# Patient Record
Sex: Female | Born: 1980 | Race: White | Hispanic: No | Marital: Married | Smoking: Former smoker
Health system: Southern US, Community
[De-identification: ages and names within clinical notes are randomized; demographics above are authoritative.]

## PROBLEM LIST (undated history)

## (undated) ENCOUNTER — Inpatient Hospital Stay (HOSPITAL_COMMUNITY): Payer: Self-pay

## (undated) DIAGNOSIS — R000 Tachycardia, unspecified: Secondary | ICD-10-CM

## (undated) DIAGNOSIS — Z9889 Other specified postprocedural states: Secondary | ICD-10-CM

## (undated) DIAGNOSIS — Z8719 Personal history of other diseases of the digestive system: Secondary | ICD-10-CM

## (undated) DIAGNOSIS — O24419 Gestational diabetes mellitus in pregnancy, unspecified control: Secondary | ICD-10-CM

## (undated) DIAGNOSIS — A6000 Herpesviral infection of urogenital system, unspecified: Secondary | ICD-10-CM

## (undated) DIAGNOSIS — Z87442 Personal history of urinary calculi: Secondary | ICD-10-CM

## (undated) DIAGNOSIS — R51 Headache: Secondary | ICD-10-CM

## (undated) DIAGNOSIS — R519 Headache, unspecified: Secondary | ICD-10-CM

## (undated) DIAGNOSIS — N200 Calculus of kidney: Secondary | ICD-10-CM

## (undated) DIAGNOSIS — D649 Anemia, unspecified: Secondary | ICD-10-CM

## (undated) DIAGNOSIS — Z9049 Acquired absence of other specified parts of digestive tract: Secondary | ICD-10-CM

## (undated) HISTORY — DX: Calculus of kidney: N20.0

## (undated) HISTORY — PX: TUBAL LIGATION: SHX77

## (undated) HISTORY — DX: Herpesviral infection of urogenital system, unspecified: A60.00

## (undated) HISTORY — PX: EYE SURGERY: SHX253

---
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
## 2002-08-30 ENCOUNTER — Other Ambulatory Visit: Admission: RE | Admit: 2002-08-30 | Discharge: 2002-08-30 | Payer: Self-pay | Admitting: Family Medicine

## 2003-03-16 DIAGNOSIS — Z9049 Acquired absence of other specified parts of digestive tract: Secondary | ICD-10-CM

## 2003-03-16 HISTORY — DX: Acquired absence of other specified parts of digestive tract: Z90.49

## 2003-03-16 HISTORY — PX: CHOLECYSTECTOMY: SHX55

## 2007-03-16 DIAGNOSIS — Z9889 Other specified postprocedural states: Secondary | ICD-10-CM

## 2007-03-16 HISTORY — PX: BREAST REDUCTION SURGERY: SHX8

## 2007-03-16 HISTORY — DX: Other specified postprocedural states: Z98.890

## 2008-04-27 ENCOUNTER — Emergency Department (HOSPITAL_COMMUNITY): Admission: EM | Admit: 2008-04-27 | Discharge: 2008-04-27 | Payer: Self-pay | Admitting: Emergency Medicine

## 2008-06-10 ENCOUNTER — Emergency Department (HOSPITAL_COMMUNITY): Admission: EM | Admit: 2008-06-10 | Discharge: 2008-06-10 | Payer: Self-pay | Admitting: Emergency Medicine

## 2008-06-14 ENCOUNTER — Encounter (INDEPENDENT_AMBULATORY_CARE_PROVIDER_SITE_OTHER): Payer: Self-pay | Admitting: *Deleted

## 2008-06-14 ENCOUNTER — Inpatient Hospital Stay (HOSPITAL_COMMUNITY): Admission: EM | Admit: 2008-06-14 | Discharge: 2008-06-19 | Payer: Self-pay | Admitting: Emergency Medicine

## 2008-07-03 ENCOUNTER — Encounter (INDEPENDENT_AMBULATORY_CARE_PROVIDER_SITE_OTHER): Payer: Self-pay | Admitting: *Deleted

## 2008-07-03 ENCOUNTER — Encounter: Admission: RE | Admit: 2008-07-03 | Discharge: 2008-07-03 | Payer: Self-pay | Admitting: Internal Medicine

## 2008-07-23 ENCOUNTER — Encounter: Admission: RE | Admit: 2008-07-23 | Discharge: 2008-08-21 | Payer: Self-pay | Admitting: Neurology

## 2008-08-27 ENCOUNTER — Emergency Department (HOSPITAL_COMMUNITY): Admission: EM | Admit: 2008-08-27 | Discharge: 2008-08-27 | Payer: Self-pay | Admitting: Emergency Medicine

## 2008-10-15 ENCOUNTER — Ambulatory Visit: Payer: Self-pay | Admitting: Internal Medicine

## 2008-10-15 DIAGNOSIS — R143 Flatulence: Secondary | ICD-10-CM

## 2008-10-15 DIAGNOSIS — R142 Eructation: Secondary | ICD-10-CM

## 2008-10-15 DIAGNOSIS — R141 Gas pain: Secondary | ICD-10-CM

## 2008-10-15 LAB — CONVERTED CEMR LAB
BUN: 10 mg/dL (ref 6–23)
Basophils Relative: 0.8 % (ref 0.0–3.0)
CO2: 27 meq/L (ref 19–32)
Calcium: 9.5 mg/dL (ref 8.4–10.5)
Chloride: 108 meq/L (ref 96–112)
Creatinine, Ser: 0.7 mg/dL (ref 0.4–1.2)
GFR calc non Af Amer: 107.14 mL/min (ref >60)
Glucose, Bld: 103 mg/dL — ABNORMAL HIGH (ref 70–99)
HCT: 42.4 % (ref 36.0–46.0)
Hemoglobin: 14.3 g/dL (ref 12.0–15.0)
Lymphocytes Relative: 27.8 % (ref 12.0–46.0)
Lymphs Abs: 1.3 10*3/uL (ref 0.7–4.0)
MCHC: 33.6 g/dL (ref 30.0–36.0)
Monocytes Relative: 6.9 % (ref 3.0–12.0)
Neutro Abs: 3.1 10*3/uL (ref 1.4–7.7)
RBC: 4.77 M/uL (ref 3.87–5.11)
TSH: 0.82 microintl units/mL (ref 0.35–5.50)
Total Bilirubin: 0.8 mg/dL (ref 0.3–1.2)

## 2008-10-16 ENCOUNTER — Encounter: Payer: Self-pay | Admitting: Internal Medicine

## 2008-10-16 ENCOUNTER — Ambulatory Visit: Payer: Self-pay | Admitting: Internal Medicine

## 2010-01-09 ENCOUNTER — Telehealth (INDEPENDENT_AMBULATORY_CARE_PROVIDER_SITE_OTHER): Payer: Self-pay | Admitting: *Deleted

## 2010-04-14 NOTE — Progress Notes (Signed)
  Phone Note Other Incoming   Request: Send information Summary of Call: Received a completed [place] medical records release. Patient is requesting record copies from 03/15/2008 to 03/14/2009. Request forwarded to [employer].

## 2010-06-24 LAB — COMPREHENSIVE METABOLIC PANEL
Albumin: 3.2 g/dL — ABNORMAL LOW (ref 3.5–5.2)
BUN: 5 mg/dL — ABNORMAL LOW (ref 6–23)
Calcium: 8.4 mg/dL (ref 8.4–10.5)
Chloride: 114 mEq/L — ABNORMAL HIGH (ref 96–112)
Creatinine, Ser: 0.57 mg/dL (ref 0.4–1.2)
GFR calc non Af Amer: >60 mL/min (ref >60)
Total Bilirubin: 0.5 mg/dL (ref 0.3–1.2)

## 2010-06-24 LAB — BASIC METABOLIC PANEL
BUN: 4 mg/dL — ABNORMAL LOW (ref 6–23)
BUN: 7 mg/dL (ref 6–23)
CO2: 21 mEq/L (ref 19–32)
Chloride: 111 mEq/L (ref 96–112)
Chloride: 113 mEq/L — ABNORMAL HIGH (ref 96–112)
Creatinine, Ser: 0.63 mg/dL (ref 0.4–1.2)
GFR calc Af Amer: >60 mL/min (ref >60)
GFR calc non Af Amer: >60 mL/min (ref >60)
Glucose, Bld: 113 mg/dL — ABNORMAL HIGH (ref 70–99)
Potassium: 3.3 mEq/L — ABNORMAL LOW (ref 3.5–5.1)
Potassium: 3.9 mEq/L (ref 3.5–5.1)
Sodium: 136 mEq/L (ref 135–145)

## 2010-06-24 LAB — URINALYSIS, ROUTINE W REFLEX MICROSCOPIC
Hgb urine dipstick: NEGATIVE
Nitrite: NEGATIVE
Protein, ur: NEGATIVE mg/dL
Specific Gravity, Urine: 1.017 (ref 1.005–1.030)
Urobilinogen, UA: 1 mg/dL (ref 0.0–1.0)

## 2010-06-24 LAB — CBC
HCT: 36.9 % (ref 36.0–46.0)
HCT: 39.2 % (ref 36.0–46.0)
Hemoglobin: 12.5 g/dL (ref 12.0–15.0)
MCHC: 34.1 g/dL (ref 30.0–36.0)
MCV: 87.9 fL (ref 78.0–100.0)
Platelets: 332 10*3/uL (ref 150–400)
RBC: 4.15 MIL/uL (ref 3.87–5.11)
WBC: 10.6 10*3/uL — ABNORMAL HIGH (ref 4.0–10.5)
WBC: 5.9 10*3/uL (ref 4.0–10.5)

## 2010-06-24 LAB — DIFFERENTIAL
Basophils Absolute: 0 10*3/uL (ref 0.0–0.1)
Lymphocytes Relative: 15 % (ref 12–46)
Monocytes Absolute: 0.5 10*3/uL (ref 0.1–1.0)
Neutro Abs: 8.4 10*3/uL — ABNORMAL HIGH (ref 1.7–7.7)

## 2010-06-24 LAB — URINE MICROSCOPIC-ADD ON

## 2010-06-24 LAB — LIPASE, BLOOD: Lipase: 19 U/L (ref 11–59)

## 2010-06-25 LAB — COMPREHENSIVE METABOLIC PANEL
AST: 16 U/L (ref 0–37)
BUN: 5 mg/dL — ABNORMAL LOW (ref 6–23)
CO2: 21 mEq/L (ref 19–32)
Calcium: 8.7 mg/dL (ref 8.4–10.5)
Creatinine, Ser: 0.65 mg/dL (ref 0.4–1.2)
GFR calc Af Amer: >60 mL/min (ref >60)
GFR calc non Af Amer: >60 mL/min (ref >60)

## 2010-06-25 LAB — CBC
MCHC: 34 g/dL (ref 30.0–36.0)
MCV: 87.9 fL (ref 78.0–100.0)
RBC: 4.56 MIL/uL (ref 3.87–5.11)

## 2010-06-25 LAB — URINALYSIS, ROUTINE W REFLEX MICROSCOPIC
Nitrite: NEGATIVE
Protein, ur: NEGATIVE mg/dL
Specific Gravity, Urine: 1.012 (ref 1.005–1.030)
Urobilinogen, UA: 1 mg/dL (ref 0.0–1.0)

## 2010-06-25 LAB — DIFFERENTIAL
Eosinophils Relative: 1 % (ref 0–5)
Lymphocytes Relative: 29 % (ref 12–46)
Lymphs Abs: 2.2 10*3/uL (ref 0.7–4.0)
Neutro Abs: 4.6 10*3/uL (ref 1.7–7.7)
Neutrophils Relative %: 63 % (ref 43–77)

## 2010-06-25 LAB — LIPASE, BLOOD: Lipase: 23 U/L (ref 11–59)

## 2010-06-25 LAB — POCT PREGNANCY, URINE: Preg Test, Ur: NEGATIVE

## 2010-06-30 LAB — POCT PREGNANCY, URINE: Preg Test, Ur: NEGATIVE

## 2010-07-28 NOTE — Discharge Summary (Signed)
NAME:  Teresa Massey, Teresa Massey NO.:  [id number]   MEDICAL RECORD NO.:  [id number]          PATIENT TYPE:  INP   LOCATION:  1408                         FACILITY:  [facility]   PHYSICIAN:  Fleet Contras, M.D.    DATE OF BIRTH:  Jan 08, 1981   DATE OF ADMISSION:  06/14/2008  DATE OF DISCHARGE:  06/19/2008                               DISCHARGE SUMMARY   HISTORY OF PRESENTING ILLNESS:  Please see the dictated H and P for full  details of presentation.  In summary, Ms. Teresa Massey was admitted via the  emergency room at [facility] with complaints of headaches,  upper abdominal pain associated with nausea, vomiting, inability to keep  food and drinks down, and failure of outpatient treatment of her  symptoms.  On admission she was not in acute respiratory or painful  distress and vital signs were stable.  Exam essentially was negative  except for some minimal epigastric tenderness.  CT scan of the abdomen  showed mild ileocolic mesenteric lymph nodes, possibly duodenitis.  X-  ray of the chest showed no acute cardiopulmonary disease.  She had a  slight elevated white count of 10.6.  Urinalysis was negative for  urinary tract infection.  She was admitted to the hospital for pain  management, nausea medications, IV fluids, and close monitoring.   HOSPITAL COURSE:  During the course of admission, her condition was slow  to improve but she was eventually able to tolerate oral intake and she  had some muscle spasms in her neck region and she was given some muscle  relaxants which helped with the symptoms.  She was able to be tapered  off of intravenous analgesic therapy and then planned for discharge  home.   DISCHARGE DIAGNOSES:  1. Migraine headaches.  2. Neck pain.  3. Dyspepsia.   CONDITION ON DISCHARGE:  Stable.   DISPOSITION:  Was for home.   FOLLOWUP:  Was to be with me in the office in about 2 weeks.   DISCHARGE MEDICATIONS:  1. Topamax 200 mg at bedtime.  2.  Relpax 40 mg as needed for headaches.  3. Norco 5/325 one p.o. q.6 p.r.n. for pain.  4. Zofran 4 mg every 6 hours p.r.n. for nausea and vomiting.  5. Adderall XR 5 mg daily.  6. Robaxin 750 mg one p.o. q.i.d. p.r.n.   This discharge plan was discussed with her and her questions were  answered.      Fleet Contras, M.D.  Electronically Signed     EA/MEDQ  D:  08/08/2008  T:  08/08/2008  Job:  [id number]

## 2010-07-28 NOTE — H&P (Signed)
NAME:  Teresa Massey, Teresa Massey NO.:  [id number]   MEDICAL RECORD NO.:  [id number]          PATIENT TYPE:  INP   LOCATION:  1408                         [facility]ACILITY:  [facility]   PHYSICIAN:  Jackie Plum, M.D.DATE OF BIRTH:  [birth year]   DATE OF ADMISSION:  06/14/2008  DATE OF DISCHARGE:  06/19/2008                              HISTORY & PHYSICAL   ADMISSION DIAGNOSES:  1. Nausea and vomiting due to a combination of      gastroparesis/gastritis.  2. Dehydration due to nausea and vomiting.  3. Mild leukocytosis, likely stress induced.  4. Hypokalemia, improved from 3.4 on 06/10/2008 to 3.5 today.  5. Mild metabolic acidosis likely due to gastrointestinal loss of      bicarbonate.  6. Hypoalbuminemia.  The patient's albumin had dropped from 3.4 to 3.2      over the last 4 days with dehydration.  Therefore, true value may      be lower than 3.2, likely due to protein calorie malnutrition      related to her illness.   CHIEF COMPLAINT:  Nausea and vomiting.   HISTORY OF PRESENT ILLNESS:  Teresa Massey was admitted to [facility] today due to persistent nausea and vomiting and failure of  outpatient treatment.  She had been seen in the ED on 06/10/2008 for the  same and was discharged home with supportive care.  She had had upper  abdominal pain which was sharp associated with nausea and vomiting.  The  pain was said to be mild to moderate in intensity.  No radiation.  She  had had no fever or chills.  She denies any dysuria or frequency of  micturition.  In the ED, the patient was treated with analgesics but  continued to have problems with upper GI symptoms and was admitted for  supportive care.   PAST MEDICAL HISTORY:  The patient denies any history of diabetes  mellitus.  Positive for a history of migraine headaches and renal  stones.  Past medical history is otherwise unremarkable.   PAST SURGICAL HISTORY:  Status post cholecystectomy.   MEDICATIONS:  1.  Depo-Provera.  2. Topamax 200 mg nightly.  3. Relpax 40 mg p.r.n.  4. Norco 5/325 mg p.r.n.  5. Zofran 4 mg p.r.n.  6. Adderall 20 mg daily.   ALLERGIES:  The patient is allergic to MORPHINE AND EITHER REGLAN OR  COMPAZINE.  The patient could not remember which one, but she says she  is allergic to one of these 2 medications.   SOCIAL HISTORY:  The patient does not smoke cigarettes or drink alcohol.  She denies any illicit drug use.  She lives with her parents and is a  Consulting civil engineer at [employer]  She has not had any recent travel.   REVIEW OF SYSTEMS:  A 10-point review of systems was unremarkable except  as noted above.   PHYSICAL EXAMINATION:  VITAL SIGNS:  BP 121/83, pulse 105, respirations  21, temperature 97.8 degrees Fahrenheit, O2 saturation 98% on room air.  GENERAL:  The patient was not in acute cardiopulmonary distress.  She  was resting in  bed.  HEENT:  Normocephalic and atraumatic.  Pupils are equal, round and  reactive to light.  Extraocular movements are intact.  Oropharynx dry.  Conjunctival pallor absent.  NECK:  No JVD.  LUNGS:  Clear to auscultation.  CARDIAC:  Regular rate and rhythm.  No murmurs, gallops or rubs.  ABDOMEN:  Soft.  Minimal epigastric tenderness without rebound  tenderness.  EXTREMITIES:  No clubbing, cyanosis or edema.  CNS:  Alert and oriented x3.  No acute focal deficit.   LABORATORIES:  The patient had a CT of the abdomen and pelvis which  showed mild ileocolonic mesenteric lymph nodes possibly due to adenitis.  Rare lung base granuloma and nonobstructive renal calculus on the right.  No evidence of appendicitis or acute pelvic process noted.  X-ray of her  chest done on her previous ED visit on 06/10/2008 revealed no active  disease.  The patient also had an upper GI study in October 2000 which  showed a small hiatal hernia.  The patient's labs today were reviewed  and noted by me to have a white count of 10.6, platelet count of 332,  hemoglobin  13.4, hematocrit 39.2.  Potassium 3.4 on 06/10/2008 and 3.5  today.  Chloride 104, sodium 139, CO2 18, glucose 95, BUN 5, creatinine  0.57, albumin 3.2.  Otherwise, LFTs within normal limits.  Amylase and  lipase noted to be within normal limits.  UA negative for any evidence  of UTI.   PLAN:  The patient was admitted for pain control, anti-nausea medication  and antiemetics, supportive care and IV fluids.  She will be on clear  liquids for now.  We will advance her diet as tolerated.  We will follow  her electrolytes and renal function.  Further recommendations will be  made as her database expands.      Jackie Plum, M.D.  Electronically Signed     GO/MEDQ  D:  06/14/2008  T:  06/14/2008  Job:  [id number]

## 2010-09-07 IMAGING — CR DG ABDOMEN 1V
1 series · 1 of 1 positions shown · non-contrast
Comparison: CT 06/14/2008

CLINICAL DATA: Nausea, vomiting.

ABDOMEN - 1 VIEW

[t abdomen supine]
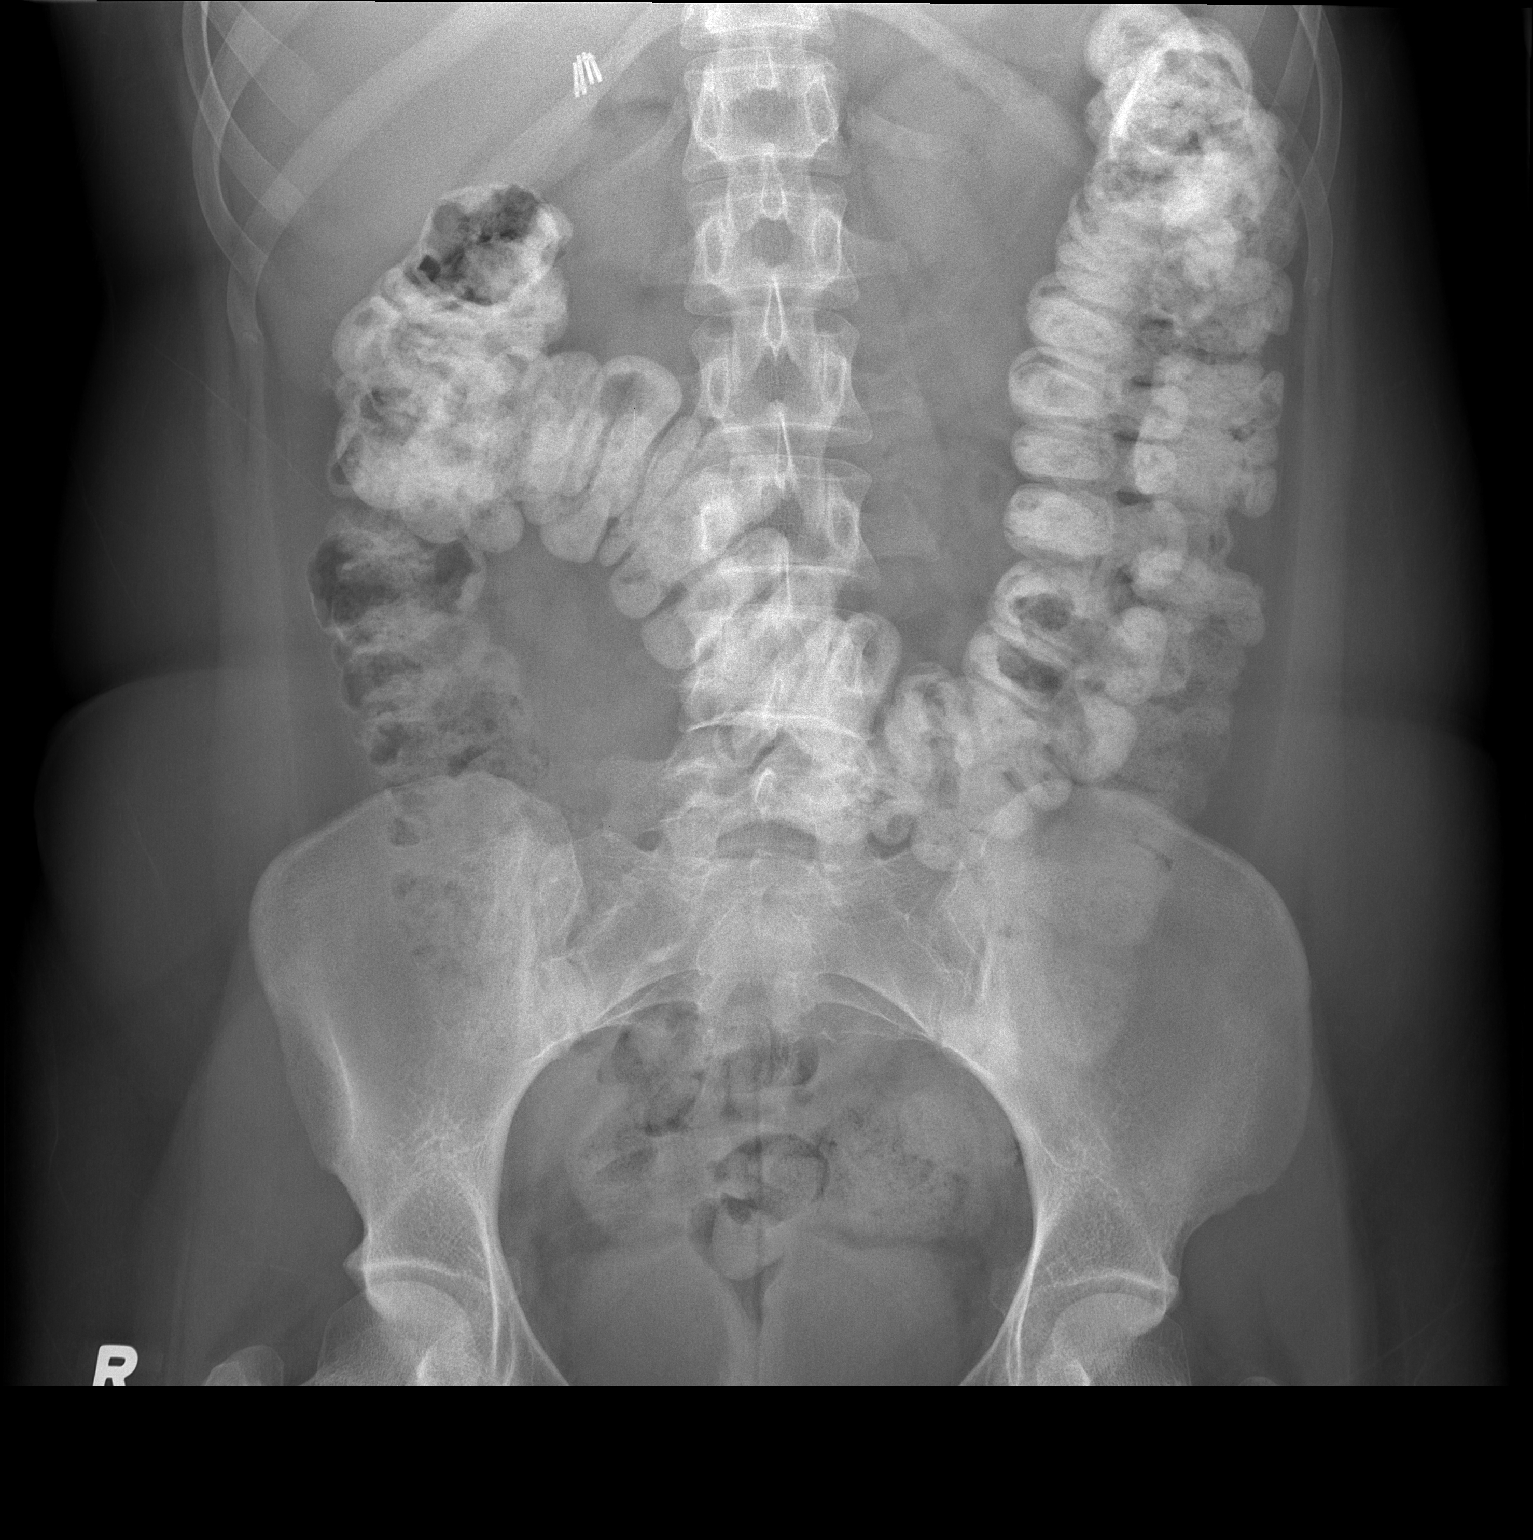

[1 of 1 positions shown; findings below may reference images not displayed]

FINDINGS: Oral contrast material noted throughout the colon which
is decompressed.  No small bowel dilatation.  The patient is status
post cholecystectomy.  No organomegaly or suspicious calcification.
No acute bony abnormality.
IMPRESSION: Oral contrast material throughout the colon.

No acute findings.

Prior cholecystectomy.

## 2013-11-13 ENCOUNTER — Encounter: Payer: Self-pay | Admitting: Internal Medicine

## 2014-02-01 LAB — US OB COMP + 14 WK

## 2014-03-04 ENCOUNTER — Encounter: Payer: Self-pay | Admitting: *Deleted

## 2014-03-04 ENCOUNTER — Encounter: Payer: Self-pay | Admitting: Family Medicine

## 2014-03-04 ENCOUNTER — Ambulatory Visit (INDEPENDENT_AMBULATORY_CARE_PROVIDER_SITE_OTHER): Payer: Self-pay | Admitting: Family Medicine

## 2014-03-04 VITALS — BP 121/69 | HR 81 | Temp 98.8°F

## 2014-03-04 DIAGNOSIS — O4413 Placenta previa with hemorrhage, third trimester: Secondary | ICD-10-CM

## 2014-03-04 DIAGNOSIS — Z3493 Encounter for supervision of normal pregnancy, unspecified, third trimester: Secondary | ICD-10-CM

## 2014-03-04 LAB — POCT URINALYSIS DIP (DEVICE)
Bilirubin Urine: NEGATIVE
GLUCOSE, UA: 100 mg/dL — AB
HGB URINE DIPSTICK: NEGATIVE
Ketones, ur: NEGATIVE mg/dL
NITRITE: NEGATIVE
PH: 6 (ref 5.0–8.0)
Protein, ur: NEGATIVE mg/dL
Urobilinogen, UA: 0.2 mg/dL (ref 0.0–1.0)

## 2014-03-04 NOTE — Patient Instructions (Signed)
Placenta Previa   Placenta previa is a condition in pregnant women where the placenta implants in the lower part of the uterus. The placenta either partially or completely covers the opening to the cervix. This is a problem because the baby must pass through the cervix during delivery. There are three types of placenta previa. They include:   1. Marginal placenta previa. The placenta is near the cervix, but does not cover the opening.  2. Partial placenta previa. The placenta covers part of the cervical opening.  3. Complete placenta previa. The placenta covers the entire cervical opening.    Depending on the type of placenta previa, there is a chance the placenta may move into a normal position and no longer cover the cervix as the pregnancy progresses. It is important to keep all prenatal visits with your caregiver.   RISK FACTORS  You may be more likely to develop placenta previa if you:   · Are carrying more than one baby (multiples).    · Have an abnormally shaped uterus.    · Have scars on the lining of the uterus.    · Had previous surgeries involving the uterus, such as a cesarean delivery.    · Have delivered a baby previously.    · Have a history of placenta previa.    · Have smoked or used cocaine during pregnancy.    · Are age 35 or older during pregnancy.    SYMPTOMS  The main symptom of placenta previa is sudden, painless vaginal bleeding during the second half of pregnancy. The amount of bleeding can be light to very heavy. The bleeding may stop on its own, but almost always returns. Cramping, regular contractions, abdominal pain, and lower back pain can also occur with placenta previa.   DIAGNOSIS  Placenta previa can be diagnosed through an ultrasound by finding where the placenta is located. The ultrasound may find placenta previa either during a routine prenatal visit or after vaginal bleeding is noticed. If you are diagnosed with placenta previa, your caregiver may avoid vaginal exams to reduce  the risk of heavy bleeding. There is a chance that placenta previa may not be diagnosed until bleeding occurs during labor.   TREATMENT  Specific treatment depends on:   · How much you are bleeding or if the bleeding has stopped.  · How far along you are in your pregnancy.    · The condition of the baby.    · The location of the baby and placenta.    · The type of placenta previa.    Depending on the factors above, your caregiver may recommend:   · Decreased activity.    · Bed rest at home or in the hospital.  · Pelvic rest. This means no sex, using tampons, douching, pelvic exams, or placing anything into the vagina.  · A blood transfusion to replace maternal blood loss.  · A cesarean delivery if the bleeding is heavy and cannot be controlled or the placenta completely covers the cervix.  · Medication to stop premature labor or mature the fetal lungs if delivery is needed before the pregnancy is full term.    WHEN SHOULD YOU SEEK IMMEDIATE MEDICAL CARE IF YOU ARE SENT HOME WITH PLACENTA PREVIA?  Seek immediate medical care if you show any symptoms of placenta previa. You will need to go to the hospital to get checked immediately. Again, those symptoms are:  · Sudden, painless vaginal bleeding, even a small amount.  · Cramping or regular contractions.  · Lower back or abdominal pain.  Document Released: 03/01/2005   Document Revised: 11/01/2012 Document Reviewed: 06/02/2012  [employer]® Patient Information ©2015 [employer]. This information is not intended to replace advice given to you by your health care provider. Make sure you discuss any questions you have with your health care provider.

## 2014-03-04 NOTE — Progress Notes (Signed)
   Subjective:    Teresa Massey is a [id number] 1920w3d being seen today for her first obstetrical visit, previously received care in [place].  Her obstetrical history is significant for UTI, 2 kidney stones.  current pregnancy has had spotting and pelvic pressure since October; last episode of spotting was Tuesday last week.  Spotting has been bright red, not related to intercourse now usually occur when pt is very active.. Patient does intend to breast feed. Pregnancy history fully reviewed.  Patient reports backache, bleeding, heartburn, no leaking and occasional contractions.  Filed Vitals:   03/04/14 1455  BP: 121/69  Pulse: 81  Temp: 98.8 F (37.1 C)    HISTORY: OB History  Gravida Para Term Preterm AB SAB TAB Ectopic Multiple Living  3 1 1  1  1   1     # Outcome Date GA Lbr Len/2nd Weight Sex Delivery Anes PTL Lv  3 Current           2 Term 11/05/06 4140w0d  6 lb 7 oz (2.92 kg) M  EPI N Y  1 TAB 2003             Past Medical History  Diagnosis Date  . Kidney stones 2008, 2011   Past Surgical History  Procedure Laterality Date  . Breast reduction surgery  2009  . Cholecystectomy  2005   History reviewed. No pertinent family history.   Exam    Uterus:      Skin: normal coloration and turgor, no rashes    Neurologic: oriented, normal   Extremities: normal strength, tone, and muscle mass   HEENT PERRLA   Mouth/Teeth mucous membranes moist, pharynx normal without lesions   Neck supple   Cardiovascular: Regular rate   Respiratory:  ear and throat exam is normal, neck free of mass or lymphadenopathy, normal effort   Abdomen: Soft, nontender, gravid          Assessment:    Pregnancy: [id number] Patient Active Problem List   Diagnosis Date Noted  . ABDOMINAL BLOATING 10/15/2008  . EPIGASTRIC PAIN 10/15/2008        Plan:     Initial labs drawn. Prenatal vitamins. Problem list reviewed and updated. Genetic Screening discussed, too late and unsure if she  had done at [place]  Ultrasound discussed; fetal survey: results from OSH requested.  Follow up in 4 weeks. 50% of 20 min visit spent on counseling and coordination of care.  Placenta previa, vaginal bleeding precautions discussed F/u ultrasound ordered to eval for previa given low lying placenta on previous ultrasound although I do not currently  Have records available to me.  Brigid Vandekamp ROCIO 03/04/2014

## 2014-03-04 NOTE — Progress Notes (Signed)
Pain/pelvic- lower abd, pelvis, vaginal  Pt reports a low lying placenta previa from provider in [place].  The provider recommended follow up US.  New ob/ 28 week packets given  Tdap vaccine consented Declined flu vaccine

## 2014-03-05 DIAGNOSIS — Z3493 Encounter for supervision of normal pregnancy, unspecified, third trimester: Secondary | ICD-10-CM | POA: Insufficient documentation

## 2014-03-05 DIAGNOSIS — O4440 Low lying placenta NOS or without hemorrhage, unspecified trimester: Secondary | ICD-10-CM | POA: Insufficient documentation

## 2014-03-05 LAB — GLUCOSE TOLERANCE, 1 HOUR (50G) W/O FASTING: Glucose, 1 Hour GTT: 146 mg/dL — ABNORMAL HIGH (ref 70–140)

## 2014-03-05 LAB — CBC
HCT: 33.7 % — ABNORMAL LOW (ref 36.0–46.0)
Hemoglobin: 11.5 g/dL — ABNORMAL LOW (ref 12.0–15.0)
MCH: 31.3 pg (ref 26.0–34.0)
MCHC: 34.1 g/dL (ref 30.0–36.0)
MCV: 91.6 fL (ref 78.0–100.0)
MPV: 10 fL (ref 9.4–12.4)
Platelets: 258 10*3/uL (ref 150–400)
RBC: 3.68 MIL/uL — ABNORMAL LOW (ref 3.87–5.11)
RDW: 13.8 % (ref 11.5–15.5)
WBC: 10.8 10*3/uL — ABNORMAL HIGH (ref 4.0–10.5)

## 2014-03-05 LAB — RPR

## 2014-03-05 LAB — HIV ANTIBODY (ROUTINE TESTING W REFLEX): HIV: NONREACTIVE

## 2014-03-06 ENCOUNTER — Other Ambulatory Visit: Payer: Self-pay | Admitting: Family Medicine

## 2014-03-06 ENCOUNTER — Ambulatory Visit (HOSPITAL_COMMUNITY)
Admission: RE | Admit: 2014-03-06 | Discharge: 2014-03-06 | Disposition: A | Discharge status: Home / Self Care | Payer: Medicaid Other | Source: Ambulatory Visit | Attending: Family Medicine | Admitting: Family Medicine

## 2014-03-06 DIAGNOSIS — Z3493 Encounter for supervision of normal pregnancy, unspecified, third trimester: Secondary | ICD-10-CM

## 2014-03-06 DIAGNOSIS — Z09 Encounter for follow-up examination after completed treatment for conditions other than malignant neoplasm: Secondary | ICD-10-CM

## 2014-03-06 DIAGNOSIS — O4413 Placenta previa with hemorrhage, third trimester: Secondary | ICD-10-CM

## 2014-03-07 DIAGNOSIS — O365930 Maternal care for other known or suspected poor fetal growth, third trimester, not applicable or unspecified: Secondary | ICD-10-CM | POA: Insufficient documentation

## 2014-03-07 DIAGNOSIS — Z3689 Encounter for other specified antenatal screening: Secondary | ICD-10-CM | POA: Insufficient documentation

## 2014-03-07 DIAGNOSIS — Z3A30 30 weeks gestation of pregnancy: Secondary | ICD-10-CM | POA: Insufficient documentation

## 2014-03-09 ENCOUNTER — Encounter (HOSPITAL_COMMUNITY): Payer: Self-pay

## 2014-03-09 ENCOUNTER — Inpatient Hospital Stay (HOSPITAL_COMMUNITY)
Admission: AD | Admit: 2014-03-09 | Discharge: 2014-03-09 | Disposition: A | Discharge status: Home / Self Care | Payer: Medicaid Other | Source: Ambulatory Visit | Attending: Obstetrics & Gynecology | Admitting: Obstetrics & Gynecology

## 2014-03-09 DIAGNOSIS — O4703 False labor before 37 completed weeks of gestation, third trimester: Secondary | ICD-10-CM | POA: Insufficient documentation

## 2014-03-09 DIAGNOSIS — Z3A31 31 weeks gestation of pregnancy: Secondary | ICD-10-CM | POA: Diagnosis not present

## 2014-03-09 DIAGNOSIS — M2550 Pain in unspecified joint: Secondary | ICD-10-CM | POA: Insufficient documentation

## 2014-03-09 DIAGNOSIS — Z87442 Personal history of urinary calculi: Secondary | ICD-10-CM | POA: Diagnosis not present

## 2014-03-09 DIAGNOSIS — R109 Unspecified abdominal pain: Secondary | ICD-10-CM | POA: Diagnosis present

## 2014-03-09 DIAGNOSIS — O4693 Antepartum hemorrhage, unspecified, third trimester: Secondary | ICD-10-CM

## 2014-03-09 DIAGNOSIS — M25551 Pain in right hip: Secondary | ICD-10-CM

## 2014-03-09 LAB — URINALYSIS, ROUTINE W REFLEX MICROSCOPIC
Bilirubin Urine: NEGATIVE
Glucose, UA: NEGATIVE mg/dL
Hgb urine dipstick: NEGATIVE
Ketones, ur: 15 mg/dL — AB
NITRITE: NEGATIVE
PH: 6 (ref 5.0–8.0)
PROTEIN: NEGATIVE mg/dL
Specific Gravity, Urine: >1.03 — ABNORMAL HIGH (ref 1.005–1.030)
Urobilinogen, UA: 1 mg/dL (ref 0.0–1.0)

## 2014-03-09 LAB — URINE MICROSCOPIC-ADD ON

## 2014-03-09 NOTE — Discharge Instructions (Signed)
Third Trimester of Pregnancy °The third trimester is from week 29 through week 42, months 7 through 9. The third trimester is a time when the fetus is growing rapidly. At the end of the ninth month, the fetus is about 20 inches in length and weighs 6-10 pounds.  °BODY CHANGES °Your body goes through many changes during pregnancy. The changes vary from woman to woman.  °· Your weight will continue to increase. You can expect to gain 25-35 pounds (11-16 kg) by the end of the pregnancy. °· You may begin to get stretch marks on your hips, abdomen, and breasts. °· You may urinate more often because the fetus is moving lower into your pelvis and pressing on your bladder. °· You may develop or continue to have heartburn as a result of your pregnancy. °· You may develop constipation because certain hormones are causing the muscles that push waste through your intestines to slow down. °· You may develop hemorrhoids or swollen, bulging veins (varicose veins). °· You may have pelvic pain because of the weight gain and pregnancy hormones relaxing your joints between the bones in your pelvis. Backaches may result from overexertion of the muscles supporting your posture. °· You may have changes in your hair. These can include thickening of your hair, rapid growth, and changes in texture. Some women also have hair loss during or after pregnancy, or hair that feels dry or thin. Your hair will most likely return to normal after your baby is born. °· Your breasts will continue to grow and be tender. A yellow discharge may leak from your breasts called colostrum. °· Your belly button may stick out. °· You may feel short of breath because of your expanding uterus. °· You may notice the fetus "dropping," or moving lower in your abdomen. °· You may have a bloody mucus discharge. This usually occurs a few days to a week before labor begins. °· Your cervix becomes thin and soft (effaced) near your due date. °WHAT TO EXPECT AT YOUR PRENATAL  EXAMS  °You will have prenatal exams every 2 weeks until week 36. Then, you will have weekly prenatal exams. During a routine prenatal visit: °· You will be weighed to make sure you and the fetus are growing normally. °· Your blood pressure is taken. °· Your abdomen will be measured to track your baby's growth. °· The fetal heartbeat will be listened to. °· Any test results from the previous visit will be discussed. °· You may have a cervical check near your due date to see if you have effaced. °At around 36 weeks, your caregiver will check your cervix. At the same time, your caregiver will also perform a test on the secretions of the vaginal tissue. This test is to determine if a type of bacteria, Group B streptococcus, is present. Your caregiver will explain this further. °Your caregiver may ask you: °· What your birth plan is. °· How you are feeling. °· If you are feeling the baby move. °· If you have had any abnormal symptoms, such as leaking fluid, bleeding, severe headaches, or abdominal cramping. °· If you have any questions. °Other tests or screenings that may be performed during your third trimester include: °· Blood tests that check for low iron levels (anemia). °· Fetal testing to check the health, activity level, and growth of the fetus. Testing is done if you have certain medical conditions or if there are problems during the pregnancy. °FALSE LABOR °You may feel small, irregular contractions that   eventually go away. These are called Braxton Hicks contractions, or false labor. Contractions may last for hours, days, or even weeks before true labor sets in. If contractions come at regular intervals, intensify, or become painful, it is best to be seen by your caregiver.  °SIGNS OF LABOR  °· Menstrual-like cramps. °· Contractions that are 5 minutes apart or less. °· Contractions that start on the top of the uterus and spread down to the lower abdomen and back. °· A sense of increased pelvic pressure or back  pain. °· A watery or bloody mucus discharge that comes from the vagina. °If you have any of these signs before the 37th week of pregnancy, call your caregiver right away. You need to go to the hospital to get checked immediately. °HOME CARE INSTRUCTIONS  °· Avoid all smoking, herbs, alcohol, and unprescribed drugs. These chemicals affect the formation and growth of the baby. °· Follow your caregiver's instructions regarding medicine use. There are medicines that are either safe or unsafe to take during pregnancy. °· Exercise only as directed by your caregiver. Experiencing uterine cramps is a good sign to stop exercising. °· Continue to eat regular, healthy meals. °· Wear a good support bra for breast tenderness. °· Do not use hot tubs, steam rooms, or saunas. °· Wear your seat belt at all times when driving. °· Avoid raw meat, uncooked cheese, cat litter boxes, and soil used by cats. These carry germs that can cause birth defects in the baby. °· Take your prenatal vitamins. °· Try taking a stool softener (if your caregiver approves) if you develop constipation. Eat more high-fiber foods, such as fresh vegetables or fruit and whole grains. Drink plenty of fluids to keep your urine clear or pale yellow. °· Take warm sitz baths to soothe any pain or discomfort caused by hemorrhoids. Use hemorrhoid cream if your caregiver approves. °· If you develop varicose veins, wear support hose. Elevate your feet for 15 minutes, 3-4 times a day. Limit salt in your diet. °· Avoid heavy lifting, wear low heal shoes, and practice good posture. °· Rest a lot with your legs elevated if you have leg cramps or low back pain. °· Visit your dentist if you have not gone during your pregnancy. Use a soft toothbrush to brush your teeth and be gentle when you floss. °· A sexual relationship may be continued unless your caregiver directs you otherwise. °· Do not travel far distances unless it is absolutely necessary and only with the approval  of your caregiver. °· Take prenatal classes to understand, practice, and ask questions about the labor and delivery. °· Make a trial run to the hospital. °· Pack your hospital bag. °· Prepare the baby's nursery. °· Continue to go to all your prenatal visits as directed by your caregiver. °SEEK MEDICAL CARE IF: °· You are unsure if you are in labor or if your water has broken. °· You have dizziness. °· You have mild pelvic cramps, pelvic pressure, or nagging pain in your abdominal area. °· You have persistent nausea, vomiting, or diarrhea. °· You have a bad smelling vaginal discharge. °· You have pain with urination. °SEEK IMMEDIATE MEDICAL CARE IF:  °· You have a fever. °· You are leaking fluid from your vagina. °· You have spotting or bleeding from your vagina. °· You have severe abdominal cramping or pain. °· You have rapid weight loss or gain. °· You have shortness of breath with chest pain. °· You notice sudden or extreme swelling   of your face, hands, ankles, feet, or legs. °· You have not felt your baby move in over an hour. °· You have severe headaches that do not go away with medicine. °· You have vision changes. °Document Released: 02/23/2001 Document Revised: 03/06/2013 Document Reviewed: 05/02/2012 °[employer]® Patient Information ©2015 [employer]. This information is not intended to replace advice given to you by your health care provider. Make sure you discuss any questions you have with your health care provider. °Fetal Movement Counts °Patient Name: __________________________________________________ Patient Due Date: ____________________ °Performing a fetal movement count is highly recommended in high-risk pregnancies, but it is good for every pregnant woman to do. Your health care provider may ask you to start counting fetal movements at 28 weeks of the pregnancy. Fetal movements often increase: °· After eating a full meal. °· After physical activity. °· After eating or drinking something sweet or  cold. °· At rest. °Pay attention to when you feel the baby is most active. This will help you notice a pattern of your baby's sleep and wake cycles and what factors contribute to an increase in fetal movement. It is important to perform a fetal movement count at the same time each day when your baby is normally most active.  °HOW TO COUNT FETAL MOVEMENTS °· Find a quiet and comfortable area to sit or lie down on your left side. Lying on your left side provides the best blood and oxygen circulation to your baby. °· Write down the day and time on a sheet of paper or in a journal. °· Start counting kicks, flutters, swishes, rolls, or jabs in a 2-hour period. You should feel at least 10 movements within 2 hours. °· If you do not feel 10 movements in 2 hours, wait 2-3 hours and count again. Look for a change in the pattern or not enough counts in 2 hours. °SEEK MEDICAL CARE IF: °· You feel less than 10 counts in 2 hours, tried twice. °· There is no movement in over an hour. °· The pattern is changing or taking longer each day to reach 10 counts in 2 hours. °· You feel the baby is not moving as he or she usually does. °Date: ____________ Movements: ____________ Start time: ____________ Finish time: ____________  °Date: ____________ Movements: ____________ Start time: ____________ Finish time: ____________ °Date: ____________ Movements: ____________ Start time: ____________ Finish time: ____________ °Date: ____________ Movements: ____________ Start time: ____________ Finish time: ____________ °Date: ____________ Movements: ____________ Start time: ____________ Finish time: ____________ °Date: ____________ Movements: ____________ Start time: ____________ Finish time: ____________ °Date: ____________ Movements: ____________ Start time: ____________ Finish time: ____________ °Date: ____________ Movements: ____________ Start time: ____________ Finish time: ____________  °Date: ____________ Movements: ____________ Start time:  ____________ Finish time: ____________ °Date: ____________ Movements: ____________ Start time: ____________ Finish time: ____________ °Date: ____________ Movements: ____________ Start time: ____________ Finish time: ____________ °Date: ____________ Movements: ____________ Start time: ____________ Finish time: ____________ °Date: ____________ Movements: ____________ Start time: ____________ Finish time: ____________ °Date: ____________ Movements: ____________ Start time: ____________ Finish time: ____________ °Date: ____________ Movements: ____________ Start time: ____________ Finish time: ____________  °Date: ____________ Movements: ____________ Start time: ____________ Finish time: ____________ °Date: ____________ Movements: ____________ Start time: ____________ Finish time: ____________ °Date: ____________ Movements: ____________ Start time: ____________ Finish time: ____________ °Date: ____________ Movements: ____________ Start time: ____________ Finish time: ____________ °Date: ____________ Movements: ____________ Start time: ____________ Finish time: ____________ °Date: ____________ Movements: ____________ Start time: ____________ Finish time: ____________ °Date: ____________ Movements: ____________ Start time: ____________ Finish time:   ____________  °Date: ____________ Movements: ____________ Start time: ____________ Finish time: ____________ °Date: ____________ Movements: ____________ Start time: ____________ Finish time: ____________ °Date: ____________ Movements: ____________ Start time: ____________ Finish time: ____________ °Date: ____________ Movements: ____________ Start time: ____________ Finish time: ____________ °Date: ____________ Movements: ____________ Start time: ____________ Finish time: ____________ °Date: ____________ Movements: ____________ Start time: ____________ Finish time: ____________ °Date: ____________ Movements: ____________ Start time: ____________ Finish time: ____________  °Date:  ____________ Movements: ____________ Start time: ____________ Finish time: ____________ °Date: ____________ Movements: ____________ Start time: ____________ Finish time: ____________ °Date: ____________ Movements: ____________ Start time: ____________ Finish time: ____________ °Date: ____________ Movements: ____________ Start time: ____________ Finish time: ____________ °Date: ____________ Movements: ____________ Start time: ____________ Finish time: ____________ °Date: ____________ Movements: ____________ Start time: ____________ Finish time: ____________ °Date: ____________ Movements: ____________ Start time: ____________ Finish time: ____________  °Date: ____________ Movements: ____________ Start time: ____________ Finish time: ____________ °Date: ____________ Movements: ____________ Start time: ____________ Finish time: ____________ °Date: ____________ Movements: ____________ Start time: ____________ Finish time: ____________ °Date: ____________ Movements: ____________ Start time: ____________ Finish time: ____________ °Date: ____________ Movements: ____________ Start time: ____________ Finish time: ____________ °Date: ____________ Movements: ____________ Start time: ____________ Finish time: ____________ °Date: ____________ Movements: ____________ Start time: ____________ Finish time: ____________  °Date: ____________ Movements: ____________ Start time: ____________ Finish time: ____________ °Date: ____________ Movements: ____________ Start time: ____________ Finish time: ____________ °Date: ____________ Movements: ____________ Start time: ____________ Finish time: ____________ °Date: ____________ Movements: ____________ Start time: ____________ Finish time: ____________ °Date: ____________ Movements: ____________ Start time: ____________ Finish time: ____________ °Date: ____________ Movements: ____________ Start time: ____________ Finish time: ____________ °Date: ____________ Movements: ____________ Start  time: ____________ Finish time: ____________  °Date: ____________ Movements: ____________ Start time: ____________ Finish time: ____________ °Date: ____________ Movements: ____________ Start time: ____________ Finish time: ____________ °Date: ____________ Movements: ____________ Start time: ____________ Finish time: ____________ °Date: ____________ Movements: ____________ Start time: ____________ Finish time: ____________ °Date: ____________ Movements: ____________ Start time: ____________ Finish time: ____________ °Date: ____________ Movements: ____________ Start time: ____________ Finish time: ____________ °Document Released: 03/31/2006 Document Revised: 07/16/2013 Document Reviewed: 12/27/2011 °[employer]® Patient Information ©2015 [employer]. This information is not intended to replace advice given to you by your health care provider. Make sure you discuss any questions you have with your health care provider. °Braxton Hicks Contractions °Contractions of the uterus can occur throughout pregnancy. Contractions are not always a sign that you are in labor.  °WHAT ARE BRAXTON HICKS CONTRACTIONS?  °Contractions that occur before labor are called Braxton Hicks contractions, or false labor. Toward the end of pregnancy (32-34 weeks), these contractions can develop more often and may become more forceful. This is not true labor because these contractions do not result in opening (dilatation) and thinning of the cervix. They are sometimes difficult to tell apart from true labor because these contractions can be forceful and people have different pain tolerances. You should not feel embarrassed if you go to the hospital with false labor. Sometimes, the only way to tell if you are in true labor is for your health care provider to look for changes in the cervix. °If there are no prenatal problems or other health problems associated with the pregnancy, it is completely safe to be sent home with false labor and await the  onset of true labor. °HOW CAN YOU TELL THE DIFFERENCE BETWEEN TRUE AND FALSE LABOR? °False Labor °· The contractions of false labor are usually shorter and not as hard as those of true labor.   °· The contractions   are usually irregular.   °· The contractions are often felt in the front of the lower abdomen and in the groin.   °· The contractions may go away when you walk around or change positions while lying down.   °· The contractions get weaker and are shorter lasting as time goes on.   °· The contractions do not usually become progressively stronger, regular, and closer together as with true labor.   °True Labor °· Contractions in true labor last 30-70 seconds, become very regular, usually become more intense, and increase in frequency.   °· The contractions do not go away with walking.   °· The discomfort is usually felt in the top of the uterus and spreads to the lower abdomen and low back.   °· True labor can be determined by your health care provider with an exam. This will show that the cervix is dilating and getting thinner.   °WHAT TO REMEMBER °· Keep up with your usual exercises and follow other instructions given by your health care provider.   °· Take medicines as directed by your health care provider.   °· Keep your regular prenatal appointments.   °· Eat and drink lightly if you think you are going into labor.   °· If Braxton Hicks contractions are making you uncomfortable:   °· Change your position from lying down or resting to walking, or from walking to resting.   °· Sit and rest in a tub of warm water.   °· Drink 2-3 glasses of water. Dehydration may cause these contractions.   °· Do slow and deep breathing several times an hour.   °WHEN SHOULD I SEEK IMMEDIATE MEDICAL CARE? °Seek immediate medical care if: °· Your contractions become stronger, more regular, and closer together.   °· You have fluid leaking or gushing from your vagina.   °· You have a fever.   °· You pass blood-tinged mucus.    °· You have vaginal bleeding.   °· You have continuous abdominal pain.   °· You have low back pain that you never had before.   °· You feel your baby's head pushing down and causing pelvic pressure.   °· Your baby is not moving as much as it used to.   °Document Released: 03/01/2005 Document Revised: 03/06/2013 Document Reviewed: 12/11/2012 °[employer]® Patient Information ©2015 [employer]. This information is not intended to replace advice given to you by your health care provider. Make sure you discuss any questions you have with your health care provider. ° °

## 2014-03-09 NOTE — MAU Note (Signed)
Pt presents complaining of leg pains, abdominal cramping and vaginal bleeding. States the leg cramps started last night and are still hurting her. States she saw some spotting when wiping. Hx of placenta previa but thinks the last ultrasound it had resolved. Has had some cramping as well. Reports good fetal movement.

## 2014-03-09 NOTE — MAU Provider Note (Signed)
History     CSN: [id number]  Arrival date and time: 03/09/14 1513   None     Chief Complaint  Patient presents with  . Vaginal Bleeding  . Leg Pain  . Abdominal Cramping   HPI This is a 33 y.o. female at 4659w4d who presents with c/o cramping, leg pains and bleeding. See note below. Had new ob appt 5 days ago. [place] Has had some bleeding over the past week. Spotting.  Pain in leg seems to radiate from groin and symphysis area.   RN Note: Pt presents complaining of leg pains, abdominal cramping and vaginal bleeding. States the leg cramps started last night and are still hurting her. States she saw some spotting when wiping. Hx of placenta previa but thinks the last ultrasound it had resolved. Has had some cramping as well. Reports good fetal movement.           OB History    Gravida Para Term Preterm AB TAB SAB Ectopic Multiple Living   3 1 1  1 1    1       Past Medical History  Diagnosis Date  . Kidney stones 2008, 2011    Past Surgical History  Procedure Laterality Date  . Breast reduction surgery  2009  . Cholecystectomy  2005    History reviewed. No pertinent family history.  History  Substance Use Topics  . Smoking status: Never Smoker   . Smokeless tobacco: Never Used  . Alcohol Use: No    Allergies:  Allergies  Allergen Reactions  . Morphine Rash    Spreads from injection site.    Prescriptions prior to admission  Medication Sig Dispense Refill Last Dose  . acetaminophen (TYLENOL) 500 MG tablet Take 1,000 mg by mouth every 6 (six) hours as needed for mild pain.    03/09/2014 at Unknown time  . cyanocobalamin 1000 MCG tablet Take 100 mcg by mouth daily.   03/08/2014 at Unknown time  . ferrous sulfate 325 (65 FE) MG tablet Take 325 mg by mouth daily with breakfast.   03/08/2014 at Unknown time  . Prenatal Vit-Fe Fumarate-FA (PRENATAL MULTIVITAMIN) TABS tablet Take 1 tablet by mouth daily at 12 noon.   03/08/2014 at  Unknown time    Review of Systems  Constitutional: Negative for fever, chills and malaise/fatigue.  Gastrointestinal: Positive for abdominal pain. Negative for nausea, vomiting, diarrhea and constipation.  Genitourinary: Negative for dysuria.       Spotting   Musculoskeletal: Positive for joint pain.  Neurological: Negative for dizziness.   Physical Exam   Blood pressure 131/79, pulse 103, temperature 98 F (36.7 C), temperature source Oral, resp. rate 18, last menstrual period 08/03/2013.  Physical Exam  Constitutional: She is oriented to person, place, and time. She appears well-developed and well-nourished. No distress.  HENT:  Head: Normocephalic.  Cardiovascular: Normal rate.   Respiratory: Effort normal.  GI: Soft. She exhibits no distension. There is no tenderness. There is no rebound and no guarding.  Genitourinary: Vagina normal. No vaginal discharge (No blood visible) found.  Musculoskeletal: Normal range of motion. She exhibits tenderness (over SP joint).  Neurological: She is alert and oriented to person, place, and time.  Skin: Skin is warm and dry.  Psychiatric: She has a normal mood and affect.   Dilation: Closed Effacement (%): Thick Cervical Position: Posterior Exam by:: Artelia LarocheM. Williams CNM  FHR reactive UCs irregular, about 3-4 in one hour  MAU Course  Procedures  MDM US from 3 days ago showed Placenta is not low lying, it is away from os Results for orders placed or performed during the hospital encounter of 03/09/14 (from the past 72 hour(s))  Urinalysis, Routine w reflex microscopic     Status: Abnormal   Collection Time: 03/09/14  4:35 PM  Result Value Ref Range   Color, Urine YELLOW YELLOW   APPearance CLEAR CLEAR   Specific Gravity, Urine >1.030 (H) 1.005 - 1.030   pH 6.0 5.0 - 8.0   Glucose, UA NEGATIVE NEGATIVE mg/dL   Hgb urine dipstick NEGATIVE NEGATIVE   Bilirubin Urine NEGATIVE NEGATIVE   Ketones, ur 15 (A) NEGATIVE mg/dL   Protein, ur  NEGATIVE NEGATIVE mg/dL   Urobilinogen, UA 1.0 0.0 - 1.0 mg/dL   Nitrite NEGATIVE NEGATIVE   Leukocytes, UA SMALL (A) NEGATIVE  Urine microscopic-add on     Status: Abnormal   Collection Time: 03/09/14  4:35 PM  Result Value Ref Range   Squamous Epithelial / LPF FEW (A) RARE   WBC, UA 7-10 <3 WBC/hpf   Bacteria, UA RARE RARE   Urine-Other MUCOUS PRESENT      Assessment and Plan  A:  [facility] at 6667w4d       Symphysis joint pain      Spotting at home, none visible on exam      Braxton Hicks contractions  P:  Discharge home       Reassured       PTL precautions       Bleeding precautions       Will try to refer to PT  [facility] CenterWILLIAMS,MARIE 03/09/2014, 4:18 PM

## 2014-03-12 ENCOUNTER — Telehealth: Payer: Self-pay | Admitting: General Practice

## 2014-03-12 DIAGNOSIS — O9989 Other specified diseases and conditions complicating pregnancy, childbirth and the puerperium: Principal | ICD-10-CM

## 2014-03-12 DIAGNOSIS — O99891 Other specified diseases and conditions complicating pregnancy: Secondary | ICD-10-CM

## 2014-03-12 DIAGNOSIS — M7918 Myalgia, other site: Secondary | ICD-10-CM

## 2014-03-12 NOTE — Telephone Encounter (Signed)
Referral order placed in epic. Called [facility]. No answer- left message on referral coordinators voicemail with patient info.

## 2014-03-12 NOTE — Telephone Encounter (Signed)
-----   Message from Aviva SignsMarie L Williams, CNM sent at 03/11/2014  9:23 PM EST ----- Regarding: RE: PT referral Yes.  Maybe they can give her recommendations.  Thanks Hilda LiasMarie  ----- Message -----    From: Kathee Deltonarrie L Evalyn Shultis, RN    Sent: 03/11/2014   9:42 AM      To: Aviva SignsMarie L Williams, CNM Subject: RE: PT referral                                Medicaid will not pay for on going PT. The only thing they will cover is a one time evaluation with a therapist to give them recommended exercises. Do you still want this?  Thanks!  ----- Message -----    From: Aviva SignsMarie L Williams, CNM    Sent: 03/09/2014   4:31 PM      To: [facility] Subject: PT referral                                    Has pain in Pubic bone, right thigh.  Probably related to some separation of pubic bone joint  Can we refer to PT without going through Ortho?  Has next appt Jan 5  Thanks marie

## 2014-03-15 NOTE — L&D Delivery Note (Signed)
Delivery Note At  a viable female was delivered via  (Presentation:OA ;  ).  APGAR: , ; weight  2720g Placenta status: delivered intact, .  Cord: 3 vessel with the following complications: none.    Anesthesia:  epidural Episiotomy:  no Lacerations:  2nd degree perineal Suture Repair: 3.0 vicryl Est. Blood Loss (mL):  300  Mom to postpartum.  Baby to Couplet care / Skin to Skin.  Healthy newborn delivered on all fours during prolonged deceleration. Immediate cry, somewhat low tone. Laceration repaired with 3.0 vicryl  Beverely Lowdamo, Roselin Wiemann 05/03/2014, 6:44 AM

## 2014-03-18 NOTE — Telephone Encounter (Signed)
Per chart review, patient contacted with an appt and refused.

## 2014-03-19 ENCOUNTER — Encounter: Payer: Self-pay | Admitting: Advanced Practice Midwife

## 2014-03-19 ENCOUNTER — Ambulatory Visit (INDEPENDENT_AMBULATORY_CARE_PROVIDER_SITE_OTHER): Payer: Medicaid Other | Admitting: Advanced Practice Midwife

## 2014-03-19 VITALS — BP 106/70 | HR 118 | Temp 98.8°F | Wt 181.9 lb

## 2014-03-19 DIAGNOSIS — R7302 Impaired glucose tolerance (oral): Secondary | ICD-10-CM

## 2014-03-19 DIAGNOSIS — O0932 Supervision of pregnancy with insufficient antenatal care, second trimester: Secondary | ICD-10-CM

## 2014-03-19 DIAGNOSIS — Z3493 Encounter for supervision of normal pregnancy, unspecified, third trimester: Secondary | ICD-10-CM

## 2014-03-19 DIAGNOSIS — R7309 Other abnormal glucose: Secondary | ICD-10-CM

## 2014-03-19 LAB — POCT URINALYSIS DIP (DEVICE)
Bilirubin Urine: NEGATIVE
Glucose, UA: 100 mg/dL — AB
Nitrite: NEGATIVE
PROTEIN: NEGATIVE mg/dL
Specific Gravity, Urine: 1.02 (ref 1.005–1.030)
UROBILINOGEN UA: 0.2 mg/dL (ref 0.0–1.0)
pH: 5.5 (ref 5.0–8.0)

## 2014-03-19 NOTE — Progress Notes (Signed)
Glucola was elevated at 146. Patient not notified. Will schedule 3 hr test. Still has some pubic bone pain.  Plans pills for contraception probably. [place] 1 [place]. FOB cannot come for birth, will try to FaceTime during labor.

## 2014-03-19 NOTE — Progress Notes (Signed)
Patient reports pelvic/pubic pain and pressure

## 2014-03-19 NOTE — Patient Instructions (Signed)
Third Trimester of Pregnancy The third trimester is from week 29 through week 42, months 7 through 9. The third trimester is a time when the fetus is growing rapidly. At the end of the ninth month, the fetus is about 20 inches in length and weighs 6-10 pounds.  BODY CHANGES Your body goes through many changes during pregnancy. The changes vary from woman to woman.   Your weight will continue to increase. You can expect to gain 25-35 pounds (11-16 kg) by the end of the pregnancy.  You may begin to get stretch marks on your hips, abdomen, and breasts.  You may urinate more often because the fetus is moving lower into your pelvis and pressing on your bladder.  You may develop or continue to have heartburn as a result of your pregnancy.  You may develop constipation because certain hormones are causing the muscles that push waste through your intestines to slow down.  You may develop hemorrhoids or swollen, bulging veins (varicose veins).  You may have pelvic pain because of the weight gain and pregnancy hormones relaxing your joints between the bones in your pelvis. Backaches may result from overexertion of the muscles supporting your posture.  You may have changes in your hair. These can include thickening of your hair, rapid growth, and changes in texture. Some women also have hair loss during or after pregnancy, or hair that feels dry or thin. Your hair will most likely return to normal after your baby is born.  Your breasts will continue to grow and be tender. A yellow discharge may leak from your breasts called colostrum.  Your belly button may stick out.  You may feel short of breath because of your expanding uterus.  You may notice the fetus "dropping," or moving lower in your abdomen.  You may have a bloody mucus discharge. This usually occurs a few days to a week before labor begins.  Your cervix becomes thin and soft (effaced) near your due date. WHAT TO EXPECT AT YOUR PRENATAL  EXAMS  You will have prenatal exams every 2 weeks until week 36. Then, you will have weekly prenatal exams. During a routine prenatal visit:  You will be weighed to make sure you and the fetus are growing normally.  Your blood pressure is taken.  Your abdomen will be measured to track your baby's growth.  The fetal heartbeat will be listened to.  Any test results from the previous visit will be discussed.  You may have a cervical check near your due date to see if you have effaced. At around 36 weeks, your caregiver will check your cervix. At the same time, your caregiver will also perform a test on the secretions of the vaginal tissue. This test is to determine if a type of bacteria, Group B streptococcus, is present. Your caregiver will explain this further. Your caregiver may ask you:  What your birth plan is.  How you are feeling.  If you are feeling the baby move.  If you have had any abnormal symptoms, such as leaking fluid, bleeding, severe headaches, or abdominal cramping.  If you have any questions. Other tests or screenings that may be performed during your third trimester include:  Blood tests that check for low iron levels (anemia).  Fetal testing to check the health, activity level, and growth of the fetus. Testing is done if you have certain medical conditions or if there are problems during the pregnancy. FALSE LABOR You may feel small, irregular contractions that   eventually go away. These are called Braxton Hicks contractions, or false labor. Contractions may last for hours, days, or even weeks before true labor sets in. If contractions come at regular intervals, intensify, or become painful, it is best to be seen by your caregiver.  SIGNS OF LABOR   Menstrual-like cramps.  Contractions that are 5 minutes apart or less.  Contractions that start on the top of the uterus and spread down to the lower abdomen and back.  A sense of increased pelvic pressure or back  pain.  A watery or bloody mucus discharge that comes from the vagina. If you have any of these signs before the 37th week of pregnancy, call your caregiver right away. You need to go to the hospital to get checked immediately. HOME CARE INSTRUCTIONS   Avoid all smoking, herbs, alcohol, and unprescribed drugs. These chemicals affect the formation and growth of the baby.  Follow your caregiver's instructions regarding medicine use. There are medicines that are either safe or unsafe to take during pregnancy.  Exercise only as directed by your caregiver. Experiencing uterine cramps is a good sign to stop exercising.  Continue to eat regular, healthy meals.  Wear a good support bra for breast tenderness.  Do not use hot tubs, steam rooms, or saunas.  Wear your seat belt at all times when driving.  Avoid raw meat, uncooked cheese, cat litter boxes, and soil used by cats. These carry germs that can cause birth defects in the baby.  Take your prenatal vitamins.  Try taking a stool softener (if your caregiver approves) if you develop constipation. Eat more high-fiber foods, such as fresh vegetables or fruit and whole grains. Drink plenty of fluids to keep your urine clear or pale yellow.  Take warm sitz baths to soothe any pain or discomfort caused by hemorrhoids. Use hemorrhoid cream if your caregiver approves.  If you develop varicose veins, wear support hose. Elevate your feet for 15 minutes, 3-4 times a day. Limit salt in your diet.  Avoid heavy lifting, wear low heal shoes, and practice good posture.  Rest a lot with your legs elevated if you have leg cramps or low back pain.  Visit your dentist if you have not gone during your pregnancy. Use a soft toothbrush to brush your teeth and be gentle when you floss.  A sexual relationship may be continued unless your caregiver directs you otherwise.  Do not travel far distances unless it is absolutely necessary and only with the approval  of your caregiver.  Take prenatal classes to understand, practice, and ask questions about the labor and delivery.  Make a trial run to the hospital.  Pack your hospital bag.  Prepare the baby's nursery.  Continue to go to all your prenatal visits as directed by your caregiver. SEEK MEDICAL CARE IF:  You are unsure if you are in labor or if your water has broken.  You have dizziness.  You have mild pelvic cramps, pelvic pressure, or nagging pain in your abdominal area.  You have persistent nausea, vomiting, or diarrhea.  You have a bad smelling vaginal discharge.  You have pain with urination. SEEK IMMEDIATE MEDICAL CARE IF:   You have a fever.  You are leaking fluid from your vagina.  You have spotting or bleeding from your vagina.  You have severe abdominal cramping or pain.  You have rapid weight loss or gain.  You have shortness of breath with chest pain.  You notice sudden or extreme swelling   of your face, hands, ankles, feet, or legs.  You have not felt your baby move in over an hour.  You have severe headaches that do not go away with medicine.  You have vision changes. Document Released: 02/23/2001 Document Revised: 03/06/2013 Document Reviewed: 05/02/2012 [facility] Patient Information 2015 [employer], [place]. This information is not intended to replace advice given to you by your health care provider. Make sure you discuss any questions you have with your health care provider. Glucose Tolerance Test This is a test to see how your body processes carbohydrates. This test is often done to check patients for diabetes or the possibility of developing it. PREPARATION FOR TEST You should have nothing to eat or drink 12 hours before the test. You will be given a form of sugar (glucose) and then blood samples will be drawn from your vein to determine the level of sugar in your blood. Alternatively, blood may be drawn from your finger for testing. You should not smoke or  exercise during the test. NORMAL FINDINGS  Fasting: 70-115 mg/dL  30 minutes: less than 200 mg/dL  1 hour: less than 409200 mg/dL  2 hours: less than 811140 mg/dL  3 hours: 91-47870-115 mg/dL  4 hours: 29-56270-115 mg/dL Ranges for normal findings may vary among different laboratories and hospitals. You should always check with your doctor after having lab work or other tests done to discuss the meaning of your test results and whether your values are considered within normal limits. MEANING OF TEST Your caregiver will go over the test results with you and discuss the importance and meaning of your results, as well as treatment options and the need for additional tests. OBTAINING THE TEST RESULTS It is your responsibility to obtain your test results. Ask the lab or department performing the test when and how you will get your results. Document Released: 03/24/2004 Document Revised: 05/24/2011 Document Reviewed: 07/06/2013 [facility] Patient Information 2015 [employer], [place]. This information is not intended to replace advice given to you by your health care provider. Make sure you discuss any questions you have with your health care provider.

## 2014-03-20 ENCOUNTER — Other Ambulatory Visit: Payer: Medicaid Other

## 2014-03-20 ENCOUNTER — Telehealth: Payer: Self-pay

## 2014-03-20 ENCOUNTER — Telehealth: Payer: Self-pay | Admitting: *Deleted

## 2014-03-20 DIAGNOSIS — O365931 Maternal care for other known or suspected poor fetal growth, third trimester, fetus 1: Secondary | ICD-10-CM

## 2014-03-20 DIAGNOSIS — R7309 Other abnormal glucose: Secondary | ICD-10-CM

## 2014-03-20 NOTE — Telephone Encounter (Signed)
Patient here in clinic for 3hr gtt.

## 2014-03-20 NOTE — Telephone Encounter (Addendum)
-----   Message from Perry MountKristy Rocio Acosta, MD sent at 03/19/2014 11:08 PM EST ----- Pt needs growth sono, I ordered one (not sure if went through, got an error message).  She will need one around 33.5, so about 1 week.  Please notify patient.  Her glucola was also elevated, so when you call you can notify her of both in case she hasn't already been notified of that as well   ----- Message -----    From: Rad Results In Interface    Sent: 03/07/2014   3:57 PM      To: Perry MountKristy Rocio Acosta, MD   Pt in clinic today for 3hr GTT.  Informed of need for US. Pt voiced understanding and appt scheduled 1/14 @ 1415.

## 2014-03-20 NOTE — Telephone Encounter (Signed)
-----   Message from Perry MountKristy Rocio Acosta, MD sent at 03/19/2014  9:57 PM EST ----- Needs 3h gtt per protocol   ----- Message -----    From: Lab in [facility] Interface    Sent: 03/05/2014   1:31 AM      To: Perry MountKristy Rocio Acosta, MD

## 2014-03-21 ENCOUNTER — Encounter: Payer: Self-pay | Admitting: Physician Assistant

## 2014-03-21 ENCOUNTER — Telehealth: Payer: Self-pay

## 2014-03-21 LAB — GLUCOSE TOLERANCE, 3 HOURS
GLUCOSE, 1 HOUR-GESTATIONAL: 165 mg/dL (ref 70–189)
GLUCOSE, FASTING-GESTATIONAL: 90 mg/dL (ref 70–104)
Glucose Tolerance, 2 hour: 148 mg/dL (ref 70–164)
Glucose, GTT - 3 Hour: 73 mg/dL (ref 70–144)

## 2014-03-21 NOTE — Telephone Encounter (Signed)
Patient sent echart message stating she was having a headache and hot/cold sweats unrelieved by tylenol and wanted to know what she could take. Called patient who explains she is feeling significantly better, though still does have a "touch" of a headache. Reports feeling headache, dizziness and sweats yesterday after having three hour gtt. Explained to patient that this could have been because her sugar was low. Patient reports trying to remain hydrated and eating "hearty" meals since being home yesterday. Advised patient to continue to stay hydrated and eat frequent, balanced meals throughout the day. Informed her tylenol can be taken for headache. Advised she call clinic if headache gets worse or does not go away. Also advised she continue to take her PNV and iron supplement as anemia can often cause headaches/dizziness. Informed patient of normal 3hr gtt results. Patient verbalized understanding and gratitude. No further questions or concerns.

## 2014-03-27 ENCOUNTER — Encounter: Payer: Self-pay | Admitting: Advanced Practice Midwife

## 2014-03-28 ENCOUNTER — Ambulatory Visit (HOSPITAL_COMMUNITY)
Admission: RE | Admit: 2014-03-28 | Discharge: 2014-03-28 | Disposition: A | Discharge status: Home / Self Care | Payer: Medicaid Other | Source: Ambulatory Visit | Attending: Family Medicine | Admitting: Family Medicine

## 2014-03-28 DIAGNOSIS — Z3A34 34 weeks gestation of pregnancy: Secondary | ICD-10-CM | POA: Insufficient documentation

## 2014-03-28 DIAGNOSIS — O365990 Maternal care for other known or suspected poor fetal growth, unspecified trimester, not applicable or unspecified: Secondary | ICD-10-CM | POA: Insufficient documentation

## 2014-03-28 DIAGNOSIS — O365931 Maternal care for other known or suspected poor fetal growth, third trimester, fetus 1: Secondary | ICD-10-CM

## 2014-03-29 ENCOUNTER — Encounter: Payer: Self-pay | Admitting: *Deleted

## 2014-04-02 ENCOUNTER — Ambulatory Visit (INDEPENDENT_AMBULATORY_CARE_PROVIDER_SITE_OTHER): Payer: Medicaid Other | Admitting: Physician Assistant

## 2014-04-02 VITALS — BP 112/78 | HR 89 | Wt 183.4 lb

## 2014-04-02 DIAGNOSIS — O365930 Maternal care for other known or suspected poor fetal growth, third trimester, not applicable or unspecified: Secondary | ICD-10-CM

## 2014-04-02 DIAGNOSIS — Z3493 Encounter for supervision of normal pregnancy, unspecified, third trimester: Secondary | ICD-10-CM

## 2014-04-02 DIAGNOSIS — Z3483 Encounter for supervision of other normal pregnancy, third trimester: Secondary | ICD-10-CM

## 2014-04-02 LAB — POCT URINALYSIS DIP (DEVICE)
BILIRUBIN URINE: NEGATIVE
GLUCOSE, UA: NEGATIVE mg/dL
Ketones, ur: NEGATIVE mg/dL
NITRITE: NEGATIVE
PH: 6.5 (ref 5.0–8.0)
Protein, ur: NEGATIVE mg/dL
Specific Gravity, Urine: 1.025 (ref 1.005–1.030)
Urobilinogen, UA: 0.2 mg/dL (ref 0.0–1.0)

## 2014-04-02 NOTE — Progress Notes (Signed)
34 weeks, stable.  Endorses good fetal movement.  Denies LOF, vag bleeding but is complaining of pain with urination.   PNV qd Obtain missing labwork today RTC 2 weeks.

## 2014-04-02 NOTE — Patient Instructions (Signed)

## 2014-04-03 LAB — PRENATAL PROFILE (SOLSTAS)
Antibody Screen: NEGATIVE
BASOS PCT: 0 % (ref 0–1)
Basophils Absolute: 0 10*3/uL (ref 0.0–0.1)
EOS ABS: 0.1 10*3/uL (ref 0.0–0.7)
EOS PCT: 1 % (ref 0–5)
HCT: 37.2 % (ref 36.0–46.0)
HIV 1&2 Ab, 4th Generation: NONREACTIVE
Hemoglobin: 12.7 g/dL (ref 12.0–15.0)
Hepatitis B Surface Ag: NEGATIVE
Lymphocytes Relative: 15 % (ref 12–46)
Lymphs Abs: 1.4 10*3/uL (ref 0.7–4.0)
MCH: 31.4 pg (ref 26.0–34.0)
MCHC: 34.1 g/dL (ref 30.0–36.0)
MCV: 91.9 fL (ref 78.0–100.0)
MONO ABS: 0.8 10*3/uL (ref 0.1–1.0)
MPV: 10 fL (ref 8.6–12.4)
Monocytes Relative: 8 % (ref 3–12)
NEUTROS ABS: 7.2 10*3/uL (ref 1.7–7.7)
Neutrophils Relative %: 76 % (ref 43–77)
Platelets: 269 10*3/uL (ref 150–400)
RBC: 4.05 MIL/uL (ref 3.87–5.11)
RDW: 13.5 % (ref 11.5–15.5)
Rh Type: POSITIVE
Rubella: 1.21 Index — ABNORMAL HIGH (ref <0.90)
WBC: 9.5 10*3/uL (ref 4.0–10.5)

## 2014-04-04 LAB — URINE CULTURE

## 2014-04-08 ENCOUNTER — Inpatient Hospital Stay (HOSPITAL_COMMUNITY)
Admission: AD | Admit: 2014-04-08 | Discharge: 2014-04-09 | Disposition: A | Discharge status: Home / Self Care | Payer: Medicaid Other | Source: Ambulatory Visit | Attending: Obstetrics & Gynecology | Admitting: Obstetrics & Gynecology

## 2014-04-08 ENCOUNTER — Encounter (HOSPITAL_COMMUNITY): Payer: Self-pay | Admitting: *Deleted

## 2014-04-08 DIAGNOSIS — R51 Headache: Secondary | ICD-10-CM | POA: Diagnosis present

## 2014-04-08 DIAGNOSIS — G43919 Migraine, unspecified, intractable, without status migrainosus: Secondary | ICD-10-CM | POA: Insufficient documentation

## 2014-04-08 DIAGNOSIS — Z3A35 35 weeks gestation of pregnancy: Secondary | ICD-10-CM | POA: Insufficient documentation

## 2014-04-08 DIAGNOSIS — O9989 Other specified diseases and conditions complicating pregnancy, childbirth and the puerperium: Secondary | ICD-10-CM | POA: Diagnosis not present

## 2014-04-08 DIAGNOSIS — Z87442 Personal history of urinary calculi: Secondary | ICD-10-CM | POA: Insufficient documentation

## 2014-04-08 LAB — COMPREHENSIVE METABOLIC PANEL
ALT: 10 U/L (ref 0–35)
ANION GAP: 6 (ref 5–15)
AST: 15 U/L (ref 0–37)
Albumin: 2.7 g/dL — ABNORMAL LOW (ref 3.5–5.2)
Alkaline Phosphatase: 93 U/L (ref 39–117)
BUN: 10 mg/dL (ref 6–23)
CHLORIDE: 106 mmol/L (ref 96–112)
CO2: 26 mmol/L (ref 19–32)
Calcium: 9.4 mg/dL (ref 8.4–10.5)
Creatinine, Ser: 0.63 mg/dL (ref 0.50–1.10)
GFR calc non Af Amer: >90 mL/min (ref >90)
Glucose, Bld: 93 mg/dL (ref 70–99)
Potassium: 3.8 mmol/L (ref 3.5–5.1)
SODIUM: 138 mmol/L (ref 135–145)
Total Bilirubin: 0.2 mg/dL — ABNORMAL LOW (ref 0.3–1.2)
Total Protein: 6.4 g/dL (ref 6.0–8.3)

## 2014-04-08 LAB — PROTEIN / CREATININE RATIO, URINE
Creatinine, Urine: 60 mg/dL
Protein Creatinine Ratio: 0.1 (ref 0.00–0.15)
Total Protein, Urine: 6 mg/dL

## 2014-04-08 LAB — URINE MICROSCOPIC-ADD ON

## 2014-04-08 LAB — URINALYSIS, ROUTINE W REFLEX MICROSCOPIC
Bilirubin Urine: NEGATIVE
Glucose, UA: NEGATIVE mg/dL
KETONES UR: NEGATIVE mg/dL
Nitrite: NEGATIVE
PH: 7 (ref 5.0–8.0)
Protein, ur: NEGATIVE mg/dL
SPECIFIC GRAVITY, URINE: 1.015 (ref 1.005–1.030)
Urobilinogen, UA: 0.2 mg/dL (ref 0.0–1.0)

## 2014-04-08 LAB — CBC
HCT: 36.5 % (ref 36.0–46.0)
Hemoglobin: 12.2 g/dL (ref 12.0–15.0)
MCH: 31.3 pg (ref 26.0–34.0)
MCHC: 33.4 g/dL (ref 30.0–36.0)
MCV: 93.6 fL (ref 78.0–100.0)
Platelets: 218 10*3/uL (ref 150–400)
RBC: 3.9 MIL/uL (ref 3.87–5.11)
RDW: 13.7 % (ref 11.5–15.5)
WBC: 8.6 10*3/uL (ref 4.0–10.5)

## 2014-04-08 MED ORDER — HYDROMORPHONE HCL 1 MG/ML IJ SOLN
0.5000 mg | Freq: Once | INTRAMUSCULAR | Status: AC
  Administered 2014-04-08: 0.5 mg via INTRAVENOUS
  Filled 2014-04-08: qty 1

## 2014-04-08 MED ORDER — DIPHENHYDRAMINE HCL 50 MG/ML IJ SOLN
25.0000 mg | Freq: Once | INTRAMUSCULAR | Status: AC
  Administered 2014-04-08: 25 mg via INTRAVENOUS
  Filled 2014-04-08: qty 1

## 2014-04-08 MED ORDER — BUTALBITAL-APAP-CAFFEINE 50-325-40 MG PO TABS: 1.0000 | ORAL_TABLET | ORAL | Status: DC | PRN

## 2014-04-08 MED ORDER — DEXAMETHASONE SODIUM PHOSPHATE 10 MG/ML IJ SOLN
10.0000 mg | Freq: Once | INTRAMUSCULAR | Status: AC
  Administered 2014-04-08: 10 mg via INTRAVENOUS
  Filled 2014-04-08: qty 1

## 2014-04-08 MED ORDER — SODIUM CHLORIDE 0.9 % IV BOLUS (SEPSIS)
1000.0000 mL | Freq: Once | INTRAVENOUS | Status: AC
  Administered 2014-04-08: 1000 mL via INTRAVENOUS

## 2014-04-08 MED ORDER — PROMETHAZINE HCL 25 MG/ML IJ SOLN: 10.0000 mg | Freq: Once | INTRAMUSCULAR | Status: DC

## 2014-04-08 MED ORDER — BUTALBITAL-APAP-CAFFEINE 50-325-40 MG PO TABS
1.0000 | ORAL_TABLET | Freq: Once | ORAL | Status: AC
  Administered 2014-04-08: 1 via ORAL
  Filled 2014-04-08: qty 1

## 2014-04-08 MED ORDER — BUTALBITAL-APAP-CAFFEINE 50-325-40 MG PO TABS: 1.0000 | ORAL_TABLET | Freq: Four times a day (QID) | ORAL | Status: DC | PRN

## 2014-04-08 MED ORDER — METOCLOPRAMIDE HCL 5 MG/ML IJ SOLN
10.0000 mg | Freq: Once | INTRAMUSCULAR | Status: AC
Start: 2014-04-08 — End: 2014-04-08
  Administered 2014-04-08: 10 mg via INTRAVENOUS
  Filled 2014-04-08: qty 2

## 2014-04-08 NOTE — MAU Note (Signed)
Pt vomited approximately 200 cc's brown thick liquid.

## 2014-04-08 NOTE — Discharge Instructions (Signed)

## 2014-04-08 NOTE — MAU Note (Signed)
Pt states she has had severe HA since 2230 last night, hx of migraines in the past but has been a very long time ago.  Denies HTN.  Denies uc's, bleeding or LOF.

## 2014-04-08 NOTE — MAU Provider Note (Signed)
Chief Complaint:  Headache  HPI: Teresa Massey is a 34 y.o. G3P1011 at [redacted]w[redacted]d who presents to maternity admissions reporting severe HA for 24hrs. Patient states she initially recognized the HA late last night. This did not keep her from sleeping overnight, however this HA have progressed and worsened throughout the day today. HA is "pounding" and felt "all over" but slightly worse on the Rt side. Patient reported tunnel vision, and some floaters. Patient states she has had migraines in the past. These have no been experienced since she moved to the [facility] reports this is b/c she had a significant amount of stress here.  Patient reports taking 1g tylenol and 1 tablet of benadryl at home without any improvement.   Denies contractions, leakage of fluid or vaginal bleeding. Good fetal movement.   Pregnancy Course:  Clinic [facility] Prenatal Labs  Dating  Blood type:    Genetic Screen 1 Screen:                 AFP:                    Quad:                  NIPS: Antibody:   Anatomic [place] Posterior placenta (no low lying/previa) @ 30.5, EFW 33% AC 6% f/u for growth > Rubella:    GTT Early:              Third trimester: 146  3hr: 90, 165, 148, 73 RPR: NON REAC (12/21 1654)   TDaP vaccine  Had Tetanus in [place], not sure if pertussis HBsAg:     Flu vaccine  HIV:     GBS  GBS:   Contraception  MiniPills Pap:  Baby Food breast   Circumcision  Yes but this is a girl   Pediatrician    Support Person  Family, FOB cannot come    Transfer from [place], records pending  Past Medical History: Past Medical History  Diagnosis Date  . Kidney stones 2008, 2011    Past obstetric history: OB History  Gravida Para Term Preterm AB SAB TAB Ectopic Multiple Living  # Outcome Date GA Lbr Len/2nd Weight Sex Delivery Anes PTL Lv  3 Current           2 Term 11/05/06 [redacted]w[redacted]d  2.92 kg (6 lb 7 oz) M  EPI N Y  1 TAB 2003              Past Surgical History: Past Surgical History  Procedure  Laterality Date  . Breast reduction surgery  2009  . Cholecystectomy  2005     Family History: History reviewed. No pertinent family history.  Social History: History  Substance Use Topics  . Smoking status: Never Smoker   . Smokeless tobacco: Never Used  . Alcohol Use: No    Allergies:  Allergies  Allergen Reactions  . Morphine Rash    Spreads from injection site.    Meds:  Prescriptions prior to admission  Medication Sig Dispense Refill Last Dose  . acetaminophen (TYLENOL) 500 MG tablet Take 1,000 mg by mouth every 6 (six) hours as needed for mild pain.    04/08/2014 at Unknown time  . calcium carbonate (TUMS - DOSED IN MG ELEMENTAL CALCIUM) 500 MG chewable tablet Chew 2 tablets by mouth 3 (three) times daily as needed for indigestion or heartburn.   04/08/2014  at Unknown time  . cyanocobalamin 1000 MCG tablet Take 100 mcg by mouth daily.   Past Week at Unknown time  . ferrous sulfate 325 (65 FE) MG tablet Take 325 mg by mouth daily with breakfast.   04/08/2014 at Unknown time  . Prenatal Vit-Fe Fumarate-FA (PRENATAL MULTIVITAMIN) TABS tablet Take 1 tablet by mouth daily at 12 noon.   04/08/2014 at Unknown time    ROS: Pertinent findings in history of present illness.  Physical Exam  Blood pressure 114/58, pulse 104, temperature 97.6 F (36.4 C), temperature source Oral, resp. rate 20, last menstrual period 08/03/2013. GENERAL: Well-developed, well-nourished female in no acute distress.  HEENT: normocephalic; EOMI, PERRLA, obvious photosensitivity HEART: RRR, no murmurs appreciated RESP: normal effort, CTAB ABDOMEN: Soft, non-tender, gravid appropriate for gestational age EXTREMITIES: Nontender, no edema, dorsalis pedis and post tibialis pulses present and symmetrical; strength 5/5 bilat, sensation intact NEURO: alert and oriented, no gross defects appreciated, CN II-XII grossly intact.    FHT:  Baseline 150 , moderate variability, accelerations present, no  decelerations Contractions: minimal/none   Labs: Results for orders placed or performed during the hospital encounter of 04/08/14 (from the past 24 hour(s))  Urinalysis, Routine w reflex microscopic     Status: Abnormal   Collection Time: 04/08/14  8:35 PM  Result Value Ref Range   Color, Urine YELLOW YELLOW   APPearance CLEAR CLEAR   Specific Gravity, Urine 1.015 1.005 - 1.030   pH 7.0 5.0 - 8.0   Glucose, UA NEGATIVE NEGATIVE mg/dL   Hgb urine dipstick SMALL (A) NEGATIVE   Bilirubin Urine NEGATIVE NEGATIVE   Ketones, ur NEGATIVE NEGATIVE mg/dL   Protein, ur NEGATIVE NEGATIVE mg/dL   Urobilinogen, UA 0.2 0.0 - 1.0 mg/dL   Nitrite NEGATIVE NEGATIVE   Leukocytes, UA TRACE (A) NEGATIVE  Protein / creatinine ratio, urine     Status: None   Collection Time: 04/08/14  8:35 PM  Result Value Ref Range   Creatinine, Urine 60.00 mg/dL   Total Protein, Urine 6 mg/dL   Protein Creatinine Ratio 0.10 0.00 - 0.15  Urine microscopic-add on     Status: Abnormal   Collection Time: 04/08/14  8:35 PM  Result Value Ref Range   Squamous Epithelial / LPF FEW (A) RARE   WBC, UA 0-2 <3 WBC/hpf   RBC / HPF 3-6 <3 RBC/hpf   Bacteria, UA FEW (A) RARE  CBC     Status: None   Collection Time: 04/08/14  9:11 PM  Result Value Ref Range   WBC 8.6 4.0 - 10.5 K/uL   RBC 3.90 3.87 - 5.11 MIL/uL   Hemoglobin 12.2 12.0 - 15.0 g/dL   HCT 11.9 14.7 - 82.9 %   MCV 93.6 78.0 - 100.0 fL   MCH 31.3 26.0 - 34.0 pg   MCHC 33.4 30.0 - 36.0 g/dL   RDW 56.2 13.0 - 86.5 %   Platelets 218 150 - 400 K/uL  Comprehensive metabolic panel     Status: Abnormal   Collection Time: 04/08/14  9:11 PM  Result Value Ref Range   Sodium 138 135 - 145 mmol/L   Potassium 3.8 3.5 - 5.1 mmol/L   Chloride 106 96 - 112 mmol/L   CO2 26 19 - 32 mmol/L   Glucose, Bld 93 70 - 99 mg/dL   BUN 10 6 - 23 mg/dL   Creatinine, Ser 7.84 0.50 - 1.10 mg/dL   Calcium 9.4 8.4 - 69.6 mg/dL   Total Protein 6.4 6.0 -  8.3 g/dL   Albumin 2.7 (L) 3.5  - 5.2 g/dL   AST 15 0 - 37 U/L   ALT 10 0 - 35 U/L   Alkaline Phosphatase 93 39 - 117 U/L   Total Bilirubin 0.2 (L) 0.3 - 1.2 mg/dL   GFR calc non Af Amer >90 >90 mL/min   GFR calc Af Amer >90 >90 mL/min   Anion gap 6 5 - 15    Imaging:  [place] Ob Follow Up  03/28/2014   OBSTETRICAL ULTRASOUND: This exam was performed within a [place] [facility]. The OB US report was generated in the AS system, and faxed to the ordering physician.   This report is available in the [employer] PACS. See the [facility] Obstetric US report via the Image Link.  MAU Course: - Patient had first episode of emesis just prior to provider seeing her. - 1L NS bolus; Decadron 10mg ; Benadryl 25mg ; Reglan 10mg   - provided little to no relief - Provided 1 tablet of Fioricet and 0.5mg  Dilaudid >> good response - Deemed stable and appropriate for DC  Assessment: 1. Intractable migraine, unspecified migraine type    Plan: Discharge home Provided script for Fioricet x20 tablets Labor precautions and fetal kick counts      Follow-up Information    Schedule an appointment as soon as possible for a visit in 1 day to follow up.   Why:  If symptoms worsen       Medication List    TAKE these medications        acetaminophen 500 MG tablet  Commonly known as:  TYLENOL  Take 1,000 mg by mouth every 6 (six) hours as needed for mild pain.     butalbital-acetaminophen-caffeine 50-325-40 MG per tablet  Commonly known as:  FIORICET  Take 1-2 tablets by mouth every 6 (six) hours as needed for headache.     calcium carbonate 500 MG chewable tablet  Commonly known as:  TUMS - dosed in mg elemental calcium  Chew 2 tablets by mouth 3 (three) times daily as needed for indigestion or heartburn.     cyanocobalamin 1000 MCG tablet  Take 100 mcg by mouth daily.     ferrous sulfate 325 (65 FE) MG tablet  Take 325 mg by mouth daily with breakfast.     prenatal multivitamin Tabs tablet  Take 1 tablet by mouth daily at 12  noon.        Kathee DeltonIan D Phaedra Colgate, MD 04/08/2014 11:58 PM

## 2014-04-17 ENCOUNTER — Ambulatory Visit (INDEPENDENT_AMBULATORY_CARE_PROVIDER_SITE_OTHER): Payer: Medicaid Other | Admitting: Advanced Practice Midwife

## 2014-04-17 VITALS — BP 118/75 | HR 108 | Wt 190.0 lb

## 2014-04-17 DIAGNOSIS — Z3493 Encounter for supervision of normal pregnancy, unspecified, third trimester: Secondary | ICD-10-CM

## 2014-04-17 LAB — POCT URINALYSIS DIP (DEVICE)
Bilirubin Urine: NEGATIVE
GLUCOSE, UA: NEGATIVE mg/dL
Hgb urine dipstick: NEGATIVE
Leukocytes, UA: NEGATIVE
Nitrite: NEGATIVE
PROTEIN: 30 mg/dL — AB
Specific Gravity, Urine: 1.025 (ref 1.005–1.030)
UROBILINOGEN UA: 0.2 mg/dL (ref 0.0–1.0)
pH: 6 (ref 5.0–8.0)

## 2014-04-17 LAB — OB RESULTS CONSOLE GBS: GBS: NEGATIVE

## 2014-04-17 NOTE — Progress Notes (Signed)
Having cramps every 20 min since last night. No concern for water breaking, nml discharge, no bleeding. Still good fetal movement. No dysuria.  Continue PNV Passed 3hr GTT (03/20/2014) GBS collected today Pediatrician: Unsure of name but thinking [facility] RTC 1 week

## 2014-04-17 NOTE — Patient Instructions (Signed)
Braxton Hicks Contractions °Contractions of the uterus can occur throughout pregnancy. Contractions are not always a sign that you are in labor.  °WHAT ARE BRAXTON HICKS CONTRACTIONS?  °Contractions that occur before labor are called Braxton Hicks contractions, or false labor. Toward the end of pregnancy (32-34 weeks), these contractions can develop more often and may become more forceful. This is not true labor because these contractions do not result in opening (dilatation) and thinning of the cervix. They are sometimes difficult to tell apart from true labor because these contractions can be forceful and people have different pain tolerances. You should not feel embarrassed if you go to the hospital with false labor. Sometimes, the only way to tell if you are in true labor is for your health care provider to look for changes in the cervix. °If there are no prenatal problems or other health problems associated with the pregnancy, it is completely safe to be sent home with false labor and await the onset of true labor. °HOW CAN YOU TELL THE DIFFERENCE BETWEEN TRUE AND FALSE LABOR? °False Labor °· The contractions of false labor are usually shorter and not as hard as those of true labor.   °· The contractions are usually irregular.   °· The contractions are often felt in the front of the lower abdomen and in the groin.   °· The contractions may go away when you walk around or change positions while lying down.   °· The contractions get weaker and are shorter lasting as time goes on.   °· The contractions do not usually become progressively stronger, regular, and closer together as with true labor.   °True Labor °1. Contractions in true labor last 30-70 seconds, become very regular, usually become more intense, and increase in frequency.   °2. The contractions do not go away with walking.   °3. The discomfort is usually felt in the top of the uterus and spreads to the lower abdomen and low back.   °4. True labor can  be determined by your health care provider with an exam. This will show that the cervix is dilating and getting thinner.   °WHAT TO REMEMBER °· Keep up with your usual exercises and follow other instructions given by your health care provider.   °· Take medicines as directed by your health care provider.   °· Keep your regular prenatal appointments.   °· Eat and drink lightly if you think you are going into labor.   °· If Braxton Hicks contractions are making you uncomfortable:   °· Change your position from lying down or resting to walking, or from walking to resting.   °· Sit and rest in a tub of warm water.   °· Drink 2-3 glasses of water. Dehydration may cause these contractions.   °· Do slow and deep breathing several times an hour.   °WHEN SHOULD I SEEK IMMEDIATE MEDICAL CARE? °Seek immediate medical care if: °· Your contractions become stronger, more regular, and closer together.   °· You have fluid leaking or gushing from your vagina.   °· You have a fever.   °· You pass blood-tinged mucus.   °· You have vaginal bleeding.   °· You have continuous abdominal pain.   °· You have low back pain that you never had before.   °· You feel your baby's head pushing down and causing pelvic pressure.   °· Your baby is not moving as much as it used to.   °Document Released: 03/01/2005 Document Revised: 03/06/2013 Document Reviewed: 12/11/2012 °[employer]® Patient Information ©2015 [employer]. This information is not intended to replace advice given to you by your health care   provider. Make sure you discuss any questions you have with your health care provider. ° °Fetal Movement Counts °Patient Name: __________________________________________________ Patient Due Date: ____________________ °Performing a fetal movement count is highly recommended in high-risk pregnancies, but it is good for every pregnant woman to do. Your health care provider may ask you to start counting fetal movements at 28 weeks of the pregnancy. Fetal  movements often increase: °· After eating a full meal. °· After physical activity. °· After eating or drinking something sweet or cold. °· At rest. °Pay attention to when you feel the baby is most active. This will help you notice a pattern of your baby's sleep and wake cycles and what factors contribute to an increase in fetal movement. It is important to perform a fetal movement count at the same time each day when your baby is normally most active.  °HOW TO COUNT FETAL MOVEMENTS °5. Find a quiet and comfortable area to sit or lie down on your left side. Lying on your left side provides the best blood and oxygen circulation to your baby. °6. Write down the day and time on a sheet of paper or in a journal. °7. Start counting kicks, flutters, swishes, rolls, or jabs in a 2-hour period. You should feel at least 10 movements within 2 hours. °8. If you do not feel 10 movements in 2 hours, wait 2-3 hours and count again. Look for a change in the pattern or not enough counts in 2 hours. °SEEK MEDICAL CARE IF: °· You feel less than 10 counts in 2 hours, tried twice. °· There is no movement in over an hour. °· The pattern is changing or taking longer each day to reach 10 counts in 2 hours. °· You feel the baby is not moving as he or she usually does. °Date: ____________ Movements: ____________ Start time: ____________ Finish time: ____________  °Date: ____________ Movements: ____________ Start time: ____________ Finish time: ____________ °Date: ____________ Movements: ____________ Start time: ____________ Finish time: ____________ °Date: ____________ Movements: ____________ Start time: ____________ Finish time: ____________ °Date: ____________ Movements: ____________ Start time: ____________ Finish time: ____________ °Date: ____________ Movements: ____________ Start time: ____________ Finish time: ____________ °Date: ____________ Movements: ____________ Start time: ____________ Finish time: ____________ °Date: ____________  Movements: ____________ Start time: ____________ Finish time: ____________  °Date: ____________ Movements: ____________ Start time: ____________ Finish time: ____________ °Date: ____________ Movements: ____________ Start time: ____________ Finish time: ____________ °Date: ____________ Movements: ____________ Start time: ____________ Finish time: ____________ °Date: ____________ Movements: ____________ Start time: ____________ Finish time: ____________ °Date: ____________ Movements: ____________ Start time: ____________ Finish time: ____________ °Date: ____________ Movements: ____________ Start time: ____________ Finish time: ____________ °Date: ____________ Movements: ____________ Start time: ____________ Finish time: ____________  °Date: ____________ Movements: ____________ Start time: ____________ Finish time: ____________ °Date: ____________ Movements: ____________ Start time: ____________ Finish time: ____________ °Date: ____________ Movements: ____________ Start time: ____________ Finish time: ____________ °Date: ____________ Movements: ____________ Start time: ____________ Finish time: ____________ °Date: ____________ Movements: ____________ Start time: ____________ Finish time: ____________ °Date: ____________ Movements: ____________ Start time: ____________ Finish time: ____________ °Date: ____________ Movements: ____________ Start time: ____________ Finish time: ____________  °Date: ____________ Movements: ____________ Start time: ____________ Finish time: ____________ °Date: ____________ Movements: ____________ Start time: ____________ Finish time: ____________ °Date: ____________ Movements: ____________ Start time: ____________ Finish time: ____________ °Date: ____________ Movements: ____________ Start time: ____________ Finish time: ____________ °Date: ____________ Movements: ____________ Start time: ____________ Finish time: ____________ °Date: ____________ Movements: ____________ Start time:  ____________ Finish time: ____________ °Date: ____________ Movements:   ____________ Start time: ____________ Finish time: ____________  °Date: ____________ Movements: ____________ Start time: ____________ Finish time: ____________ °Date: ____________ Movements: ____________ Start time: ____________ Finish time: ____________ °Date: ____________ Movements: ____________ Start time: ____________ Finish time: ____________ °Date: ____________ Movements: ____________ Start time: ____________ Finish time: ____________ °Date: ____________ Movements: ____________ Start time: ____________ Finish time: ____________ °Date: ____________ Movements: ____________ Start time: ____________ Finish time: ____________ °Date: ____________ Movements: ____________ Start time: ____________ Finish time: ____________  °Date: ____________ Movements: ____________ Start time: ____________ Finish time: ____________ °Date: ____________ Movements: ____________ Start time: ____________ Finish time: ____________ °Date: ____________ Movements: ____________ Start time: ____________ Finish time: ____________ °Date: ____________ Movements: ____________ Start time: ____________ Finish time: ____________ °Date: ____________ Movements: ____________ Start time: ____________ Finish time: ____________ °Date: ____________ Movements: ____________ Start time: ____________ Finish time: ____________ °Date: ____________ Movements: ____________ Start time: ____________ Finish time: ____________  °Date: ____________ Movements: ____________ Start time: ____________ Finish time: ____________ °Date: ____________ Movements: ____________ Start time: ____________ Finish time: ____________ °Date: ____________ Movements: ____________ Start time: ____________ Finish time: ____________ °Date: ____________ Movements: ____________ Start time: ____________ Finish time: ____________ °Date: ____________ Movements: ____________ Start time: ____________ Finish time: ____________ °Date:  ____________ Movements: ____________ Start time: ____________ Finish time: ____________ °Date: ____________ Movements: ____________ Start time: ____________ Finish time: ____________  °Date: ____________ Movements: ____________ Start time: ____________ Finish time: ____________ °Date: ____________ Movements: ____________ Start time: ____________ Finish time: ____________ °Date: ____________ Movements: ____________ Start time: ____________ Finish time: ____________ °Date: ____________ Movements: ____________ Start time: ____________ Finish time: ____________ °Date: ____________ Movements: ____________ Start time: ____________ Finish time: ____________ °Date: ____________ Movements: ____________ Start time: ____________ Finish time: ____________ °Document Released: 03/31/2006 Document Revised: 07/16/2013 Document Reviewed: 12/27/2011 °[employer]® Patient Information ©2015 [employer]. This information is not intended to replace advice given to you by your health care provider. Make sure you discuss any questions you have with your health care provider. ° °

## 2014-04-17 NOTE — Progress Notes (Signed)
I was present for the exam and agree with above.  [place], [place] 04/17/2014 4:34 PM

## 2014-04-17 NOTE — Progress Notes (Signed)
Pt reports not feeling well, didn't sleep last night.

## 2014-04-18 ENCOUNTER — Ambulatory Visit (HOSPITAL_COMMUNITY)
Admission: RE | Admit: 2014-04-18 | Discharge: 2014-04-18 | Disposition: A | Discharge status: Home / Self Care | Payer: Medicaid Other | Source: Ambulatory Visit | Attending: Physician Assistant | Admitting: Physician Assistant

## 2014-04-18 DIAGNOSIS — Z364 Encounter for antenatal screening for fetal growth retardation: Secondary | ICD-10-CM | POA: Insufficient documentation

## 2014-04-18 DIAGNOSIS — Z3A36 36 weeks gestation of pregnancy: Secondary | ICD-10-CM | POA: Insufficient documentation

## 2014-04-18 DIAGNOSIS — Z3493 Encounter for supervision of normal pregnancy, unspecified, third trimester: Secondary | ICD-10-CM

## 2014-04-18 DIAGNOSIS — Z3483 Encounter for supervision of other normal pregnancy, third trimester: Secondary | ICD-10-CM | POA: Insufficient documentation

## 2014-04-19 ENCOUNTER — Encounter (HOSPITAL_COMMUNITY): Payer: Self-pay

## 2014-04-19 ENCOUNTER — Inpatient Hospital Stay (HOSPITAL_COMMUNITY)
Admission: AD | Admit: 2014-04-19 | Discharge: 2014-04-20 | Disposition: A | Discharge status: Home / Self Care | Payer: Medicaid Other | Source: Ambulatory Visit | Attending: Obstetrics & Gynecology | Admitting: Obstetrics & Gynecology

## 2014-04-19 DIAGNOSIS — Z3A37 37 weeks gestation of pregnancy: Secondary | ICD-10-CM | POA: Insufficient documentation

## 2014-04-19 DIAGNOSIS — O471 False labor at or after 37 completed weeks of gestation: Secondary | ICD-10-CM | POA: Insufficient documentation

## 2014-04-19 LAB — CULTURE, BETA STREP (GROUP B ONLY)

## 2014-04-19 NOTE — MAU Note (Signed)
Leaking fld since 1800. Leaked some for couple hours but not as much now. Did not wear in pad

## 2014-04-20 ENCOUNTER — Inpatient Hospital Stay (EMERGENCY_DEPARTMENT_HOSPITAL)
Admission: AD | Admit: 2014-04-20 | Discharge: 2014-04-20 | Disposition: A | Discharge status: Home / Self Care | Payer: Medicaid Other | Source: Ambulatory Visit | Attending: Obstetrics & Gynecology | Admitting: Obstetrics & Gynecology

## 2014-04-20 ENCOUNTER — Encounter (HOSPITAL_COMMUNITY): Payer: Self-pay | Admitting: *Deleted

## 2014-04-20 DIAGNOSIS — O471 False labor at or after 37 completed weeks of gestation: Secondary | ICD-10-CM | POA: Diagnosis present

## 2014-04-20 DIAGNOSIS — O368131 Decreased fetal movements, third trimester, fetus 1: Secondary | ICD-10-CM

## 2014-04-20 DIAGNOSIS — Z3A37 37 weeks gestation of pregnancy: Secondary | ICD-10-CM

## 2014-04-20 DIAGNOSIS — O368130 Decreased fetal movements, third trimester, not applicable or unspecified: Secondary | ICD-10-CM

## 2014-04-20 DIAGNOSIS — Z3A38 38 weeks gestation of pregnancy: Secondary | ICD-10-CM

## 2014-04-20 LAB — POCT FERN TEST: POCT Fern Test: NEGATIVE

## 2014-04-20 NOTE — Discharge Instructions (Signed)
Braxton Hicks Contractions °Contractions of the uterus can occur throughout pregnancy. Contractions are not always a sign that you are in labor.  °WHAT ARE BRAXTON HICKS CONTRACTIONS?  °Contractions that occur before labor are called Braxton Hicks contractions, or false labor. Toward the end of pregnancy (32-34 weeks), these contractions can develop more often and may become more forceful. This is not true labor because these contractions do not result in opening (dilatation) and thinning of the cervix. They are sometimes difficult to tell apart from true labor because these contractions can be forceful and people have different pain tolerances. You should not feel embarrassed if you go to the hospital with false labor. Sometimes, the only way to tell if you are in true labor is for your health care provider to look for changes in the cervix. °If there are no prenatal problems or other health problems associated with the pregnancy, it is completely safe to be sent home with false labor and await the onset of true labor. °HOW CAN YOU TELL THE DIFFERENCE BETWEEN TRUE AND FALSE LABOR? °False Labor °· The contractions of false labor are usually shorter and not as hard as those of true labor.   °· The contractions are usually irregular.   °· The contractions are often felt in the front of the lower abdomen and in the groin.   °· The contractions may go away when you walk around or change positions while lying down.   °· The contractions get weaker and are shorter lasting as time goes on.   °· The contractions do not usually become progressively stronger, regular, and closer together as with true labor.   °True Labor °· Contractions in true labor last 30-70 seconds, become very regular, usually become more intense, and increase in frequency.   °· The contractions do not go away with walking.   °· The discomfort is usually felt in the top of the uterus and spreads to the lower abdomen and low back.   °· True labor can be  determined by your health care provider with an exam. This will show that the cervix is dilating and getting thinner.   °WHAT TO REMEMBER °· Keep up with your usual exercises and follow other instructions given by your health care provider.   °· Take medicines as directed by your health care provider.   °· Keep your regular prenatal appointments.   °· Eat and drink lightly if you think you are going into labor.   °· If Braxton Hicks contractions are making you uncomfortable:   °¨ Change your position from lying down or resting to walking, or from walking to resting.   °¨ Sit and rest in a tub of warm water.   °¨ Drink 2-3 glasses of water. Dehydration may cause these contractions.   °¨ Do slow and deep breathing several times an hour.   °WHEN SHOULD I SEEK IMMEDIATE MEDICAL CARE? °Seek immediate medical care if: °· Your contractions become stronger, more regular, and closer together.   °· You have fluid leaking or gushing from your vagina.   °· You have a fever.   °· You pass blood-tinged mucus.   °· You have vaginal bleeding.   °· You have continuous abdominal pain.   °· You have low back pain that you never had before.   °· You feel your baby's head pushing down and causing pelvic pressure.   °· Your baby is not moving as much as it used to.   °Document Released: 03/01/2005 Document Revised: 03/06/2013 Document Reviewed: 12/11/2012 °Exit[employer]® Patient Information ©2015 [employer]. This information is not intended to replace advice given to you by your health care   provider. Make sure you discuss any questions you have with your health care provider. Third Trimester of Pregnancy The third trimester is from week 29 through week 42, months 7 through 9. The third trimester is a time when the fetus is growing rapidly. At the end of the ninth month, the fetus is about 20 inches in length and weighs 6-10 pounds.  BODY CHANGES Your body goes through many changes during pregnancy. The changes vary from woman to woman.    Your weight will continue to increase. You can expect to gain 25-35 pounds (11-16 kg) by the end of the pregnancy.  You may begin to get stretch marks on your hips, abdomen, and breasts.  You may urinate more often because the fetus is moving lower into your pelvis and pressing on your bladder.  You may develop or continue to have heartburn as a result of your pregnancy.  You may develop constipation because certain hormones are causing the muscles that push waste through your intestines to slow down.  You may develop hemorrhoids or swollen, bulging veins (varicose veins).  You may have pelvic pain because of the weight gain and pregnancy hormones relaxing your joints between the bones in your pelvis. Backaches may result from overexertion of the muscles supporting your posture.  You may have changes in your hair. These can include thickening of your hair, rapid growth, and changes in texture. Some women also have hair loss during or after pregnancy, or hair that feels dry or thin. Your hair will most likely return to normal after your baby is born.  Your breasts will continue to grow and be tender. A yellow discharge may leak from your breasts called colostrum.  Your belly button may stick out.  You may feel short of breath because of your expanding uterus.  You may notice the fetus "dropping," or moving lower in your abdomen.  You may have a bloody mucus discharge. This usually occurs a few days to a week before labor begins.  Your cervix becomes thin and soft (effaced) near your due date. WHAT TO EXPECT AT YOUR PRENATAL EXAMS  You will have prenatal exams every 2 weeks until week 36. Then, you will have weekly prenatal exams. During a routine prenatal visit:  You will be weighed to make sure you and the fetus are growing normally.  Your blood pressure is taken.  Your abdomen will be measured to track your baby's growth.  The fetal heartbeat will be listened to.  Any test  results from the previous visit will be discussed.  You may have a cervical check near your due date to see if you have effaced. At around 36 weeks, your caregiver will check your cervix. At the same time, your caregiver will also perform a test on the secretions of the vaginal tissue. This test is to determine if a type of bacteria, Group B streptococcus, is present. Your caregiver will explain this further. Your caregiver may ask you:  What your birth plan is.  How you are feeling.  If you are feeling the baby move.  If you have had any abnormal symptoms, such as leaking fluid, bleeding, severe headaches, or abdominal cramping.  If you have any questions. Other tests or screenings that may be performed during your third trimester include:  Blood tests that check for low iron levels (anemia).  Fetal testing to check the health, activity level, and growth of the fetus. Testing is done if you have certain medical conditions or if  there are problems during the pregnancy. FALSE LABOR You may feel small, irregular contractions that eventually go away. These are called Braxton Hicks contractions, or false labor. Contractions may last for hours, days, or even weeks before true labor sets in. If contractions come at regular intervals, intensify, or become painful, it is best to be seen by your caregiver.  SIGNS OF LABOR   Menstrual-like cramps.  Contractions that are 5 minutes apart or less.  Contractions that start on the top of the uterus and spread down to the lower abdomen and back.  A sense of increased pelvic pressure or back pain.  A watery or bloody mucus discharge that comes from the vagina. If you have any of these signs before the 37th week of pregnancy, call your caregiver right away. You need to go to the hospital to get checked immediately. HOME CARE INSTRUCTIONS   Avoid all smoking, herbs, alcohol, and unprescribed drugs. These chemicals affect the formation and growth of  the baby.  Follow your caregiver's instructions regarding medicine use. There are medicines that are either safe or unsafe to take during pregnancy.  Exercise only as directed by your caregiver. Experiencing uterine cramps is a good sign to stop exercising.  Continue to eat regular, healthy meals.  Wear a good support bra for breast tenderness.  Do not use hot tubs, steam rooms, or saunas.  Wear your seat belt at all times when driving.  Avoid raw meat, uncooked cheese, cat litter boxes, and soil used by cats. These carry germs that can cause birth defects in the baby.  Take your prenatal vitamins.  Try taking a stool softener (if your caregiver approves) if you develop constipation. Eat more high-fiber foods, such as fresh vegetables or fruit and whole grains. Drink plenty of fluids to keep your urine clear or pale yellow.  Take warm sitz baths to soothe any pain or discomfort caused by hemorrhoids. Use hemorrhoid cream if your caregiver approves.  If you develop varicose veins, wear support hose. Elevate your feet for 15 minutes, 3-4 times a day. Limit salt in your diet.  Avoid heavy lifting, wear low heal shoes, and practice good posture.  Rest a lot with your legs elevated if you have leg cramps or low back pain.  Visit your dentist if you have not gone during your pregnancy. Use a soft toothbrush to brush your teeth and be gentle when you floss.  A sexual relationship may be continued unless your caregiver directs you otherwise.  Do not travel far distances unless it is absolutely necessary and only with the approval of your caregiver.  Take prenatal classes to understand, practice, and ask questions about the labor and delivery.  Make a trial run to the hospital.  Pack your hospital bag.  Prepare the baby's nursery.  Continue to go to all your prenatal visits as directed by your caregiver. SEEK MEDICAL CARE IF:  You are unsure if you are in labor or if your water  has broken.  You have dizziness.  You have mild pelvic cramps, pelvic pressure, or nagging pain in your abdominal area.  You have persistent nausea, vomiting, or diarrhea.  You have a bad smelling vaginal discharge.  You have pain with urination. SEEK IMMEDIATE MEDICAL CARE IF:   You have a fever.  You are leaking fluid from your vagina.  You have spotting or bleeding from your vagina.  You have severe abdominal cramping or pain.  You have rapid weight loss or gain.  You have shortness of breath with chest pain.  You notice sudden or extreme swelling of your face, hands, ankles, feet, or legs.  You have not felt your baby move in over an hour.  You have severe headaches that do not go away with medicine.  You have vision changes. Document Released: 02/23/2001 Document Revised: 03/06/2013 Document Reviewed: 05/02/2012 [facility] Patient Information 2015 [place] South[place], [place]. This information is not intended to replace advice given to you by your health care provider. Make sure you discuss any questions you have with your health care provider. Fetal Movement Counts Patient Name: __________________________________________________ Patient Due Date: ____________________ Performing a fetal movement count is highly recommended in high-risk pregnancies, but it is good for every pregnant woman to do. Your health care provider may ask you to start counting fetal movements at 28 weeks of the pregnancy. Fetal movements often increase:  After eating a full meal.  After physical activity.  After eating or drinking something sweet or cold.  At rest. Pay attention to when you feel the baby is most active. This will help you notice a pattern of your baby's sleep and wake cycles and what factors contribute to an increase in fetal movement. It is important to perform a fetal movement count at the same time each day when your baby is normally most active.  HOW TO COUNT FETAL MOVEMENTS  Find a  quiet and comfortable area to sit or lie down on your left side. Lying on your left side provides the best blood and oxygen circulation to your baby.  Write down the day and time on a sheet of paper or in a journal.  Start counting kicks, flutters, swishes, rolls, or jabs in a 2-hour period. You should feel at least 10 movements within 2 hours.  If you do not feel 10 movements in 2 hours, wait 2-3 hours and count again. Look for a change in the pattern or not enough counts in 2 hours. SEEK MEDICAL CARE IF:  You feel less than 10 counts in 2 hours, tried twice.  There is no movement in over an hour.  The pattern is changing or taking longer each day to reach 10 counts in 2 hours.  You feel the baby is not moving as he or she usually does. Date: ____________ Movements: ____________ Start time: ____________ Teresa MartinFinish time: ____________  Date: ____________ Movements: ____________ Start time: ____________ Teresa MartinFinish time: ____________ Date: ____________ Movements: ____________ Start time: ____________ Teresa MartinFinish time: ____________ Date: ____________ Movements: ____________ Start time: ____________ Teresa MartinFinish time: ____________ Date: ____________ Movements: ____________ Start time: ____________ Teresa MartinFinish time: ____________ Date: ____________ Movements: ____________ Start time: ____________ Teresa MartinFinish time: ____________ Date: ____________ Movements: ____________ Start time: ____________ Teresa MartinFinish time: ____________ Date: ____________ Movements: ____________ Start time: ____________ Teresa MartinFinish time: ____________  Date: ____________ Movements: ____________ Start time: ____________ Teresa MartinFinish time: ____________ Date: ____________ Movements: ____________ Start time: ____________ Teresa MartinFinish time: ____________ Date: ____________ Movements: ____________ Start time: ____________ Teresa MartinFinish time: ____________ Date: ____________ Movements: ____________ Start time: ____________ Teresa MartinFinish time: ____________ Date: ____________ Movements: ____________  Start time: ____________ Teresa MartinFinish time: ____________ Date: ____________ Movements: ____________ Start time: ____________ Teresa MartinFinish time: ____________ Date: ____________ Movements: ____________ Start time: ____________ Teresa MartinFinish time: ____________  Date: ____________ Movements: ____________ Start time: ____________ Teresa MartinFinish time: ____________ Date: ____________ Movements: ____________ Start time: ____________ Teresa MartinFinish time: ____________ Date: ____________ Movements: ____________ Start time: ____________ Teresa MartinFinish time: ____________ Date: ____________ Movements: ____________ Start time: ____________ Teresa MartinFinish time: ____________ Date: ____________ Movements: ____________ Start time: ____________ Teresa MartinFinish time: ____________ Date: ____________ Movements: ____________  Start time: ____________ Teresa Massey time: ____________ Date: ____________ Movements: ____________ Start time: ____________ Teresa Massey time: ____________  Date: ____________ Movements: ____________ Start time: ____________ Teresa Massey time: ____________ Date: ____________ Movements: ____________ Start time: ____________ Teresa Massey time: ____________ Date: ____________ Movements: ____________ Start time: ____________ Teresa Massey time: ____________ Date: ____________ Movements: ____________ Start time: ____________ Teresa Massey time: ____________ Date: ____________ Movements: ____________ Start time: ____________ Teresa Massey time: ____________ Date: ____________ Movements: ____________ Start time: ____________ Teresa Massey time: ____________ Date: ____________ Movements: ____________ Start time: ____________ Teresa Massey time: ____________  Date: ____________ Movements: ____________ Start time: ____________ Teresa Massey time: ____________ Date: ____________ Movements: ____________ Start time: ____________ Teresa Massey time: ____________ Date: ____________ Movements: ____________ Start time: ____________ Teresa Massey time: ____________ Date: ____________ Movements: ____________ Start time: ____________ Teresa Massey time:  ____________ Date: ____________ Movements: ____________ Start time: ____________ Teresa Massey time: ____________ Date: ____________ Movements: ____________ Start time: ____________ Teresa Massey time: ____________ Date: ____________ Movements: ____________ Start time: ____________ Teresa Massey time: ____________  Date: ____________ Movements: ____________ Start time: ____________ Teresa Massey time: ____________ Date: ____________ Movements: ____________ Start time: ____________ Teresa Massey time: ____________ Date: ____________ Movements: ____________ Start time: ____________ Teresa Massey time: ____________ Date: ____________ Movements: ____________ Start time: ____________ Teresa Massey time: ____________ Date: ____________ Movements: ____________ Start time: ____________ Teresa Massey time: ____________ Date: ____________ Movements: ____________ Start time: ____________ Teresa Massey time: ____________ Date: ____________ Movements: ____________ Start time: ____________ Teresa Massey time: ____________  Date: ____________ Movements: ____________ Start time: ____________ Teresa Massey time: ____________ Date: ____________ Movements: ____________ Start time: ____________ Teresa Massey time: ____________ Date: ____________ Movements: ____________ Start time: ____________ Teresa Massey time: ____________ Date: ____________ Movements: ____________ Start time: ____________ Teresa Massey time: ____________ Date: ____________ Movements: ____________ Start time: ____________ Teresa Massey time: ____________ Date: ____________ Movements: ____________ Start time: ____________ Teresa Massey time: ____________ Date: ____________ Movements: ____________ Start time: ____________ Teresa Massey time: ____________  Date: ____________ Movements: ____________ Start time: ____________ Teresa Massey time: ____________ Date: ____________ Movements: ____________ Start time: ____________ Teresa Massey time: ____________ Date: ____________ Movements: ____________ Start time: ____________ Teresa Massey time: ____________ Date: ____________ Movements:  ____________ Start time: ____________ Teresa Massey time: ____________ Date: ____________ Movements: ____________ Start time: ____________ Teresa Massey time: ____________ Date: ____________ Movements: ____________ Start time: ____________ Teresa Massey time: ____________ Document Released: 03/31/2006 Document Revised: 07/16/2013 Document Reviewed: 12/27/2011 [employer] Patient Information 2015 [employer]. This information is not intended to replace advice given to you by your health care provider. Make sure you discuss any questions you have with your health care provider.

## 2014-04-20 NOTE — MAU Provider Note (Signed)
MAU note  Teresa Massey is a 34 y.o. G3P1011 reporting decreased fetal movement.  Seen in MAU several hours ago for rule out rupture early this morning.  Now with active fetal movement +FM, denies LOF, VB, contractions No HA/visual changes/increased swelling/RUQ pain  PE: BP 127/80 mmHg  Pulse 105  Temp(Src) 98.2 F (36.8 C)  Resp 18  LMP 08/03/2013 (Exact Date) Gen: calm comfortable, NAD Resp: normal effort, no distress Abd: gravid  ROS, labs, PMH reviewed NST reactive  Plan: - fetal kick counts reinforced,  labor precautions - continue routine follow up in OB clinic - initial BP at time of arrival 144/85 which I feel to be erroneous given repeat of 127/80, no elevated blood pressures => no labs done, if pt has another elevated BP may meet criteria for gHTN  Teresa Massey,Teresa Arps ROCIO, MD 3:27 PM

## 2014-04-20 NOTE — MAU Provider Note (Signed)
S: 34 y.o. G3P1011 @[redacted[facility]redacted[facility]d  [facility].  She had leakage of clear fluid when sitting on the toilet, then reports needing a panty liner for light leakage since then, but not requiring a pad or leaking onto her clothes.  She started cramping after this leakage and reports regular mild contractions at this time.  She reports good fetal movement, denies vaginal bleeding, vaginal itching/burning, urinary symptoms, h/a, dizziness, n/v, or fever/chills.    O: BP 133/84 mmHg  Pulse 97  Temp(Src) 97.6 F (36.4 C)  Resp 18  Ht 5\' 4"  (1.626 m)  Wt 88.451 kg (195 lb)  BMI 33.46 kg/m2  LMP 08/03/2013 (Exact Date)   Physical Examination: General appearance - alert, well appearing, and in no distress, oriented to person, place, and time and acyanotic, in no respiratory distress  SSE with negative pooling, some mucus discharge noted  Ferning slide negative  Dilation: 1.5 Effacement (%): 50 Cervical Position: Middle Station: -3 Presentation: Vertex Exam by:: Leftwich-Kirby CNM   FHR baseline 135 with moderate variability, positive accels, no decels Contractions every 3-5 minutes, mild to moderate to palpation  A: G3P1011 @[redacted]w[redacted]d  Threatened labor at term  P: RN to recheck cervix in 1 hour D/C home if no cervical change  Sharen CounterLisa Leftwich-Kirby Certified Nurse-Midwife

## 2014-04-20 NOTE — Discharge Instructions (Signed)

## 2014-04-20 NOTE — MAU Note (Signed)
Pt presents to MAU with complaints of a decrease in fetal movement since she got out of bed this am. Denies any vaginal bleeding or LOF

## 2014-04-24 ENCOUNTER — Inpatient Hospital Stay (HOSPITAL_COMMUNITY)
Admission: AD | Admit: 2014-04-24 | Discharge: 2014-04-24 | Disposition: A | Discharge status: Home / Self Care | Payer: Medicaid Other | Source: Ambulatory Visit | Attending: Obstetrics & Gynecology | Admitting: Obstetrics & Gynecology

## 2014-04-24 ENCOUNTER — Encounter: Payer: Medicaid Other | Admitting: Advanced Practice Midwife

## 2014-04-24 ENCOUNTER — Telehealth: Payer: Self-pay | Admitting: *Deleted

## 2014-04-24 ENCOUNTER — Encounter (HOSPITAL_COMMUNITY): Payer: Self-pay | Admitting: *Deleted

## 2014-04-24 DIAGNOSIS — O4693 Antepartum hemorrhage, unspecified, third trimester: Secondary | ICD-10-CM | POA: Diagnosis not present

## 2014-04-24 DIAGNOSIS — Z87442 Personal history of urinary calculi: Secondary | ICD-10-CM | POA: Insufficient documentation

## 2014-04-24 DIAGNOSIS — N939 Abnormal uterine and vaginal bleeding, unspecified: Secondary | ICD-10-CM | POA: Diagnosis present

## 2014-04-24 DIAGNOSIS — Z3A38 38 weeks gestation of pregnancy: Secondary | ICD-10-CM | POA: Insufficient documentation

## 2014-04-24 HISTORY — DX: Anemia, unspecified: D64.9

## 2014-04-24 LAB — URINALYSIS, ROUTINE W REFLEX MICROSCOPIC
Bilirubin Urine: NEGATIVE
Glucose, UA: NEGATIVE mg/dL
Hgb urine dipstick: NEGATIVE
KETONES UR: NEGATIVE mg/dL
Leukocytes, UA: NEGATIVE
NITRITE: NEGATIVE
PH: 6 (ref 5.0–8.0)
Protein, ur: NEGATIVE mg/dL
Specific Gravity, Urine: 1.015 (ref 1.005–1.030)
Urobilinogen, UA: 0.2 mg/dL (ref 0.0–1.0)

## 2014-04-24 LAB — OB RESULTS CONSOLE GC/CHLAMYDIA: Gonorrhea: NEGATIVE

## 2014-04-24 NOTE — Telephone Encounter (Signed)
Pt presented to clinic for her prenatal visit. She reported to the front desk that she is bleeding. I came to desk and spoke with patient. She stated that her bleeding started this morning and has progressively gotten heavier. She has no contractions and +FM. I spoke with Nada MaclachlanKaren Teague Clark and she said to take patient to MAU. I called MAU and escorted patient up.

## 2014-04-24 NOTE — MAU Provider Note (Signed)
History     CSN: [id number]  Arrival date and time: 04/24/14 [id number]   First Provider Initiated Contact with Patient 04/24/14 1052      Chief Complaint  Patient presents with  . Vaginal Bleeding   Vaginal Bleeding Associated symptoms include nausea. Pertinent negatives include no abdominal pain, chills, constipation, diarrhea, dysuria, fever, flank pain, frequency, headaches, hematuria, urgency or vomiting.   Pt is a 33y/o G3P1011 at [redacted]wks gestation who presents to the MAU for bleeding. She had an appointment in the clinic downstairs and was sent to the MAU for further evaluation of her bleeding. She noticed spotting around 0800 when she used the restroom and subsequently became heavier the next time she went to the restroom. This is a first occurrence. Denies any trauma. Denies any abdominal pain, fever, or vomiting. She has had multiple BM (3-4) with loose stool. She has not taken any antibiotics recently. No other complaints noted.    EDD: 05/10/14 based on LMP   Past Medical History  Diagnosis Date  . Kidney stones 2008, 2011  . Anemia     Past Surgical History  Procedure Laterality Date  . Breast reduction surgery  2009  . Cholecystectomy  2005  . 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 Asthma Father   . Hypertension Father     History  Substance Use Topics  . Smoking status: Never Smoker   . Smokeless tobacco: Never Used  . Alcohol Use: No    Allergies:  Allergies  Allergen Reactions  . Morphine Rash    Spreads from injection site.    Prescriptions prior to admission  Medication Sig Dispense Refill Last Dose  . acetaminophen (TYLENOL) 500 MG tablet Take 1,000 mg by mouth every 6 (six) hours as needed for mild pain.    04/23/2014 at Unknown time  . calcium carbonate (TUMS - DOSED IN MG ELEMENTAL CALCIUM) 500 MG chewable tablet Chew 2 tablets by mouth 3 (three) times daily as needed for indigestion or  heartburn.   04/23/2014 at Unknown time  . ferrous sulfate 325 (65 FE) MG tablet Take 325 mg by mouth daily with breakfast.   Past Week at Unknown time  . Prenatal Vit-Fe Fumarate-FA (PRENATAL MULTIVITAMIN) TABS tablet Take 1 tablet by mouth daily at 12 noon.   04/24/2014 at Unknown time  . butalbital-acetaminophen-caffeine (FIORICET) 50-325-40 MG per tablet Take 1-2 tablets by mouth every 6 (six) hours as needed for headache. (Patient not taking: Reported on 04/24/2014) 20 tablet 0 04/19/2014 at Unknown time    Review of Systems  Constitutional: Negative for fever and chills.  Eyes: Negative for blurred vision.  Respiratory: Negative for cough and shortness of breath.   Cardiovascular: Negative for chest pain.  Gastrointestinal: Positive for nausea. Negative for heartburn, vomiting, abdominal pain, diarrhea, constipation and blood in stool.  Genitourinary: Positive for vaginal bleeding. Negative for dysuria, urgency, frequency, hematuria and flank pain.  Neurological: Negative for dizziness and headaches.   Physical Exam   Blood pressure 121/83, pulse 109, temperature 98.3 F (36.8 C), temperature source Oral, resp. rate 18, last menstrual period 08/03/2013.  Physical Exam  Constitutional: She appears well-developed and well-nourished.  Cardiovascular: Normal rate and regular rhythm.   Respiratory: Effort normal and breath sounds normal.  GI: Soft. Bowel sounds are normal.  Genitourinary: There is no rash or tenderness on the right labia. There is no rash  or tenderness on the left labia. Cervix exhibits no motion tenderness and no discharge. Right adnexum displays no tenderness. Left adnexum displays no tenderness. No erythema, tenderness or bleeding in the vagina. No signs of injury around the vagina.    MAU Course  Procedures -GC Swab  Assessment and Plan  A- Vaginal bleeding in 3rd trimester   P- F/u w/ OB/GYN   Eston Esters 04/24/2014, 11:03 AM   OB fellow attestation:  I  have seen and examined this patient; I agree with above documentation in the PA student's note.   Meliana Neale Burly is a 34 y.o. G3P1011 reporting vaginal spotting x 3 today.  Has also had multiple bowel movements today.  Denies having to wear a pad, no abdominal pain or contractions, no recent antibiotics.  Overall doing well +FM, denies LOF, contractions, vaginal discharge.  PE: BP 120/79 mmHg  Pulse 93  Temp(Src) 98.5 F (36.9 C) (Oral)  Resp 18  LMP 08/03/2013 (Exact Date) Gen: calm comfortable, NAD Resp: normal effort, no distress Abd: gravid GU: os visualized with speculum, no blood in vault, cervix mildly friable  ROS, labs, PMH reviewed NST reactive No contractions   Plan: - fetal kick counts reinforced,  labor precautions - continue routine follow up in OB clinic - discussed PO hydration, warning signs for diarrhea, vaginal bleeding precautions  Saprina Chuong ROCIO, MD 1:14 PM

## 2014-04-24 NOTE — MAU Note (Signed)
Pt had clinic appt this a.m. But was sent to MAU because of bleeding.  Pt stated she had spotting when she used the restroom around 0800, became heavier next time she went to the restroom.  Denies pain.

## 2014-04-25 LAB — GC/CHLAMYDIA PROBE AMP (~~LOC~~) NOT AT ARMC
Chlamydia: NEGATIVE
Neisseria Gonorrhea: NEGATIVE

## 2014-05-01 ENCOUNTER — Ambulatory Visit (INDEPENDENT_AMBULATORY_CARE_PROVIDER_SITE_OTHER): Payer: Medicaid Other | Admitting: Advanced Practice Midwife

## 2014-05-01 VITALS — BP 127/77 | HR 98 | Temp 98.4°F | Wt 197.2 lb

## 2014-05-01 DIAGNOSIS — O365931 Maternal care for other known or suspected poor fetal growth, third trimester, fetus 1: Secondary | ICD-10-CM

## 2014-05-01 DIAGNOSIS — O365930 Maternal care for other known or suspected poor fetal growth, third trimester, not applicable or unspecified: Secondary | ICD-10-CM

## 2014-05-01 DIAGNOSIS — Z23 Encounter for immunization: Secondary | ICD-10-CM

## 2014-05-01 LAB — POCT URINALYSIS DIP (DEVICE)
Glucose, UA: NEGATIVE mg/dL
Hgb urine dipstick: NEGATIVE
Nitrite: NEGATIVE
Protein, ur: NEGATIVE mg/dL
SPECIFIC GRAVITY, URINE: 1.025 (ref 1.005–1.030)
Urobilinogen, UA: 0.2 mg/dL (ref 0.0–1.0)
pH: 6 (ref 5.0–8.0)

## 2014-05-01 NOTE — Progress Notes (Signed)
Doing well.  Good fetal movement, denies vaginal bleeding, LOF, regular contractions.  Does report pelvic pain and irregular contractions, off and on last few days.  Cervical exam upon request. Reviewed signs of labor/reasons to come to MAU.

## 2014-05-01 NOTE — Progress Notes (Signed)
Edema in hands and feet.  C/o of pelvic pressure with irregular contractions.

## 2014-05-03 ENCOUNTER — Encounter (HOSPITAL_COMMUNITY): Payer: Self-pay | Admitting: *Deleted

## 2014-05-03 ENCOUNTER — Inpatient Hospital Stay (HOSPITAL_COMMUNITY): Payer: Medicaid Other | Admitting: Anesthesiology

## 2014-05-03 ENCOUNTER — Inpatient Hospital Stay (HOSPITAL_COMMUNITY)
Admission: AD | Admit: 2014-05-03 | Discharge: 2014-05-04 | DRG: 775 | Disposition: A | Discharge status: Home / Self Care | Payer: Medicaid Other | Source: Ambulatory Visit | Attending: Obstetrics & Gynecology | Admitting: Obstetrics & Gynecology

## 2014-05-03 DIAGNOSIS — Z3A39 39 weeks gestation of pregnancy: Secondary | ICD-10-CM | POA: Diagnosis present

## 2014-05-03 DIAGNOSIS — Z87442 Personal history of urinary calculi: Secondary | ICD-10-CM

## 2014-05-03 DIAGNOSIS — O471 False labor at or after 37 completed weeks of gestation: Secondary | ICD-10-CM | POA: Diagnosis present

## 2014-05-03 DIAGNOSIS — Z8249 Family history of ischemic heart disease and other diseases of the circulatory system: Secondary | ICD-10-CM

## 2014-05-03 DIAGNOSIS — IMO0001 Reserved for inherently not codable concepts without codable children: Secondary | ICD-10-CM

## 2014-05-03 HISTORY — DX: Acquired absence of other specified parts of digestive tract: Z90.49

## 2014-05-03 HISTORY — DX: Other specified postprocedural states: Z98.890

## 2014-05-03 LAB — CBC
HEMATOCRIT: 39 % (ref 36.0–46.0)
HEMOGLOBIN: 13.3 g/dL (ref 12.0–15.0)
MCH: 31.7 pg (ref 26.0–34.0)
MCHC: 34.1 g/dL (ref 30.0–36.0)
MCV: 93.1 fL (ref 78.0–100.0)
Platelets: 236 10*3/uL (ref 150–400)
RBC: 4.19 MIL/uL (ref 3.87–5.11)
RDW: 13.7 % (ref 11.5–15.5)
WBC: 12.4 10*3/uL — ABNORMAL HIGH (ref 4.0–10.5)

## 2014-05-03 LAB — TYPE AND SCREEN
ABO/RH(D): AB POS
Antibody Screen: NEGATIVE

## 2014-05-03 LAB — ABO/RH: ABO/RH(D): AB POS

## 2014-05-03 MED ORDER — FENTANYL 2.5 MCG/ML BUPIVACAINE 1/10 % EPIDURAL INFUSION (WH - ANES)
14.0000 mL/h | INTRAMUSCULAR | Status: DC | PRN
  Administered 2014-05-03: 14 mL/h via EPIDURAL
  Filled 2014-05-03: qty 125

## 2014-05-03 MED ORDER — PRENATAL MULTIVITAMIN CH
1.0000 | ORAL_TABLET | Freq: Every day | ORAL | Status: DC
  Administered 2014-05-03 – 2014-05-04 (×2): 1 via ORAL
  Filled 2014-05-03 (×2): qty 1

## 2014-05-03 MED ORDER — DIPHENHYDRAMINE HCL 50 MG/ML IJ SOLN
12.5000 mg | INTRAMUSCULAR | Status: DC | PRN
Start: 2014-05-03 — End: 2014-05-03

## 2014-05-03 MED ORDER — SIMETHICONE 80 MG PO CHEW: 80.0000 mg | CHEWABLE_TABLET | ORAL | Status: DC | PRN

## 2014-05-03 MED ORDER — FENTANYL 2.5 MCG/ML BUPIVACAINE 1/10 % EPIDURAL INFUSION (WH - ANES)
INTRAMUSCULAR | Status: DC | PRN
  Administered 2014-05-03: 14 mL/h via EPIDURAL

## 2014-05-03 MED ORDER — OXYTOCIN 40 UNITS IN LACTATED RINGERS INFUSION - SIMPLE MED
62.5000 mL/h | INTRAVENOUS | Status: DC
  Filled 2014-05-03: qty 1000

## 2014-05-03 MED ORDER — ACETAMINOPHEN 325 MG PO TABS
650.0000 mg | ORAL_TABLET | Freq: Four times a day (QID) | ORAL | Status: DC | PRN
  Administered 2014-05-03 – 2014-05-04 (×2): 650 mg via ORAL
  Filled 2014-05-03 (×2): qty 2

## 2014-05-03 MED ORDER — PHENYLEPHRINE 40 MCG/ML (10ML) SYRINGE FOR IV PUSH (FOR BLOOD PRESSURE SUPPORT)
80.0000 ug | PREFILLED_SYRINGE | INTRAVENOUS | Status: DC | PRN
  Filled 2014-05-03: qty 2

## 2014-05-03 MED ORDER — PHENYLEPHRINE 40 MCG/ML (10ML) SYRINGE FOR IV PUSH (FOR BLOOD PRESSURE SUPPORT)
80.0000 ug | PREFILLED_SYRINGE | INTRAVENOUS | Status: DC | PRN
  Filled 2014-05-03: qty 2
  Filled 2014-05-03: qty 20

## 2014-05-03 MED ORDER — CITRIC ACID-SODIUM CITRATE 334-500 MG/5ML PO SOLN: 30.0000 mL | ORAL | Status: DC | PRN

## 2014-05-03 MED ORDER — LACTATED RINGERS IV SOLN
INTRAVENOUS | Status: DC
  Administered 2014-05-03: 04:00:00 via INTRAVENOUS

## 2014-05-03 MED ORDER — LANOLIN HYDROUS EX OINT: TOPICAL_OINTMENT | CUTANEOUS | Status: DC | PRN

## 2014-05-03 MED ORDER — LACTATED RINGERS IV SOLN: INTRAVENOUS | Status: DC

## 2014-05-03 MED ORDER — ZOLPIDEM TARTRATE 5 MG PO TABS: 5.0000 mg | ORAL_TABLET | Freq: Every evening | ORAL | Status: DC | PRN

## 2014-05-03 MED ORDER — EPHEDRINE 5 MG/ML INJ
10.0000 mg | INTRAVENOUS | Status: DC | PRN
  Filled 2014-05-03: qty 2

## 2014-05-03 MED ORDER — ONDANSETRON HCL 4 MG PO TABS: 4.0000 mg | ORAL_TABLET | ORAL | Status: DC | PRN

## 2014-05-03 MED ORDER — IBUPROFEN 600 MG PO TABS
600.0000 mg | ORAL_TABLET | Freq: Four times a day (QID) | ORAL | Status: DC
  Administered 2014-05-03 (×2): 600 mg via ORAL
  Filled 2014-05-03 (×2): qty 1

## 2014-05-03 MED ORDER — TETANUS-DIPHTH-ACELL PERTUSSIS 5-2.5-18.5 LF-MCG/0.5 IM SUSP: 0.5000 mL | Freq: Once | INTRAMUSCULAR | Status: DC

## 2014-05-03 MED ORDER — LIDOCAINE HCL (PF) 1 % IJ SOLN
INTRAMUSCULAR | Status: DC | PRN
  Administered 2014-05-03 (×2): 4 mL

## 2014-05-03 MED ORDER — LACTATED RINGERS IV SOLN: 500.0000 mL | INTRAVENOUS | Status: DC | PRN

## 2014-05-03 MED ORDER — BENZOCAINE-MENTHOL 20-0.5 % EX AERO
1.0000 "application " | INHALATION_SPRAY | CUTANEOUS | Status: DC | PRN
  Filled 2014-05-03: qty 56

## 2014-05-03 MED ORDER — KETOROLAC TROMETHAMINE 10 MG PO TABS
10.0000 mg | ORAL_TABLET | Freq: Four times a day (QID) | ORAL | Status: DC | PRN
  Administered 2014-05-03 – 2014-05-04 (×4): 10 mg via ORAL
  Filled 2014-05-03 (×6): qty 1

## 2014-05-03 MED ORDER — SENNOSIDES-DOCUSATE SODIUM 8.6-50 MG PO TABS: 2.0000 | ORAL_TABLET | ORAL | Status: DC

## 2014-05-03 MED ORDER — DIPHENHYDRAMINE HCL 25 MG PO CAPS: 25.0000 mg | ORAL_CAPSULE | Freq: Four times a day (QID) | ORAL | Status: DC | PRN

## 2014-05-03 MED ORDER — ONDANSETRON HCL 4 MG/2ML IJ SOLN: 4.0000 mg | INTRAMUSCULAR | Status: DC | PRN

## 2014-05-03 MED ORDER — LACTATED RINGERS IV SOLN
500.0000 mL | Freq: Once | INTRAVENOUS | Status: AC
  Administered 2014-05-03: 500 mL via INTRAVENOUS

## 2014-05-03 MED ORDER — ACETAMINOPHEN 325 MG PO TABS: 650.0000 mg | ORAL_TABLET | ORAL | Status: DC | PRN

## 2014-05-03 MED ORDER — ONDANSETRON HCL 4 MG/2ML IJ SOLN: 4.0000 mg | Freq: Four times a day (QID) | INTRAMUSCULAR | Status: DC | PRN

## 2014-05-03 MED ORDER — OXYTOCIN BOLUS FROM INFUSION
500.0000 mL | INTRAVENOUS | Status: DC
  Administered 2014-05-03: 500 mL via INTRAVENOUS

## 2014-05-03 MED ORDER — LIDOCAINE HCL (PF) 1 % IJ SOLN
30.0000 mL | INTRAMUSCULAR | Status: DC | PRN
  Administered 2014-05-03: 30 mL via SUBCUTANEOUS
  Filled 2014-05-03: qty 30

## 2014-05-03 MED ORDER — FENTANYL CITRATE 0.05 MG/ML IJ SOLN
100.0000 ug | Freq: Once | INTRAMUSCULAR | Status: AC
  Administered 2014-05-03: 100 ug via INTRAVENOUS
  Filled 2014-05-03: qty 2

## 2014-05-03 MED ORDER — WITCH HAZEL-GLYCERIN EX PADS
1.0000 "application " | MEDICATED_PAD | CUTANEOUS | Status: DC | PRN
  Administered 2014-05-03: 1 via TOPICAL

## 2014-05-03 MED ORDER — DIBUCAINE 1 % RE OINT: 1.0000 "application " | TOPICAL_OINTMENT | RECTAL | Status: DC | PRN

## 2014-05-03 NOTE — H&P (Signed)
  Teresa Massey is a 34 y.o. female presenting for painful contractions. History  Pt reports painful contractions starting yesterday afternoon that have gradually worsened in intensity and were getting closer together (3-4 minutes at home). No bleeding, LOF, normal FM.   OB History    Gravida Para Term Preterm AB TAB SAB Ectopic Multiple Living   3 1 1  1 1    1      Past Medical History  Diagnosis Date  . Kidney stones 2008, 2011  . Anemia    Past Surgical History  Procedure Laterality Date  . Breast reduction surgery  2009  . Cholecystectomy  2005  . 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 Social History:  reports that she has never smoked. She has never used smokeless tobacco. She reports that she does not drink alcohol or use illicit drugs.   Prenatal Transfer Tool  Maternal Diabetes: No Genetic Screening: Normal Maternal Ultrasounds/Referrals: Abnormal:  Findings:   Other:poor growth at anatomy scan with resolved at repeat ultrasound, 37th %ile Fetal Ultrasounds or other Referrals:  None Maternal Substance Abuse:  No Significant Maternal Medications:  None Significant Maternal Lab Results:  Lab values include: Group B Strep negative Other Comments:  None  Review of Systems  All other systems reviewed and are negative.   Dilation: 3 Effacement (%): 90 Station: -1 Exam by:: Dr. Richarda BladeAdamo Blood pressure 118/81, pulse 94, temperature 97.5 F (36.4 C), temperature source Oral, resp. rate 17, last menstrual period 08/03/2013, SpO2 99 %. Maternal Exam:  Uterine Assessment: Contraction strength is firm.  Contraction duration is 60 seconds. Contraction frequency is regular.   Abdomen: Patient reports no abdominal tenderness. Fetal presentation: vertex  Introitus: Normal vulva. Normal vagina.  Pelvis: adequate for delivery.   Cervix: Cervix evaluated by digital exam.      Fetal Exam Fetal Monitor Review: Mode: ultrasound.   Baseline rate: 130.  Variability: moderate (6-25 bpm).   Pattern: accelerations present and variable decelerations.    Fetal State Assessment: Category II - tracings are indeterminate. Repetitive decels to 90s following fentanyl, now resolved  Physical Exam  Prenatal labs: ABO, Rh: AB/POS/-- (01/19 1234) Antibody: NEG (01/19 1234) Rubella: 1.21 (01/19 1234) RPR: NON REAC (01/19 1234)  HBsAg: NEGATIVE (01/19 1234)  HIV: NONREACTIVE (01/19 1234)  GBS:   negative  Assessment/Plan: Labor Plan #Labor: Progressing normal #Pain: Epidural on request #FWB: category 2, monitor #ID: gbs negative     Beverely Lowdamo, Elena 05/03/2014, 4:57 AM    OB fellow attestation:  I have seen and examined this patient; I agree with above documentation in the resident's note.   Teresa Massey is a 34 y.o. [id number] here for active labor  PE: BP 126/67 mmHg  Pulse 98  Temp(Src) 97.5 F (36.4 C) (Oral)  Resp 16  Ht 5\' 4"  (1.626 m)  Wt 198 lb (89.812 kg)  BMI 33.97 kg/m2  SpO2 98%  LMP 08/03/2013 (Exact Date)  Breastfeeding? Unknown Gen: calm comfortable, NAD Resp: normal effort, no distress Abd: gravid  ROS, labs, PMH reviewed  Plan: MOF: breast MOC: undecided, requests LARC ID: [id number] neg FWB: cat I Labor: expectant mgmt, suspect decelerations actually sign of impending delivery/rapid cervical change Pain: epidural  Angel Hobdy ROCIO 05/03/2014, 7:36 AM

## 2014-05-03 NOTE — MAU Note (Signed)
Patient took self off EFM; states is very uncomfortable and doesn't want the monitors on at this time. MD notified.

## 2014-05-03 NOTE — Lactation Note (Signed)
This note was copied from the chart of Teresa Massey. Lactation Consultation Note  Patient Name: Teresa Massey Massey Date: 05/03/2014 Reason for consult: Initial assessment   Initial consult at 14 hours old; GA 39.0; BW 5 lbs, 15.9 oz; Mom is a P2 exp 3 months with first child but had breast reduction after first child. Infant has breastfed x5 (15-30 min); voids-2; stools-1 since birth 10 hours ago. Scarring noted around areolas and vertical under breast and then lateral to each side at bottom of breast. Discussed with mom potential for a reduced milk supply d/t reduction and unsure of depth of scarring around areola whether how many of the ducts leading to the nipple were not affected.   Infant showing cues while LC was in room so mom latched infant with a shallow latch in cross-cradle hold on left side (erect nipple).   LC taught mom how to latch with sandwiching of breast and leading with chin (asymmetrical latching technique) for deeper latch and for good milk transfer.  Infant fed for 10 minutes and then came off crying.  No swallows heard.   Mom reports just feeding infant on left side an hour prior to [facility] visit.   Hand expression taught with average size drop of colostrum noted.  Mom was able to return demonstrate hand expression on right nipple. Mom latched infant to right breast (flat nipple) cross-cradle hold with minimal assistance from [facility] with tea cup hold.  Infant was able to maintain latch.  Infant sucked with a rhythmical pattern but no swallows were heard from [facility].  LS-7 Mom reports hearing swallows with previous latches.   RN set up DEBP in room but mom has not pumped yet.  LC reviewed pumping on preemie setting (at least 4-6 times during the day after breastfeeding) with 3-4 teardrops and using hands-on pumping method with hand expression at end of pumping session.  Teach back done with pump use. LC gave spoons, curved-tip syringe, and colostrum collection container for  EBM supplementation.  Mom does not want to start formula unless medically necessary.  Expressed her desire to exclusively breastfeed. LC assured mom staff would not give formula without her consent (first child delivered at different hospital and she attributes their breastfeeding difficulty to staff giving formula) and would monitor weight, voids, stools, and jaundice levels (as ordered) as a measurement of how much milk the baby is receiving.  Mom is aware of risks of decreased milk supply d/t breast reduction.   Mom plans to purchase DEBP after discharge.  Discussed importance of continuing to pump after discharge to encourage good milk supply. Educated on continuing to feed with feeding cues, cluster feeding, and size of infant's stomach. Lactation brochure given and informed of hospital support group and outpatient services. Encouraged to call for assistance as needed especially since mom does not have a support person staying with her tonight.      Maternal Data Formula Feeding for Exclusion: No (mom plans to BF only) Reason for exclusion: Previous breast surgery (mastectomy, reduction, or augmentation where mother is unable to produce breast milk) Has patient been taught Hand Expression?: Yes (drop of colostrum expressed from right nipple) Does the patient have breastfeeding experience prior to this delivery?: Yes  Feeding Feeding Type: Breast Fed Length of feed: 20 min  LATCH Score/Interventions Latch: Grasps breast easily, tongue down, lips flanged, rhythmical sucking.  Audible Swallowing: None (mom just fed 1 hr prior and states she heard swallows) Intervention(s): Skin to skin Intervention(s):  Hand expression  Type of Nipple: Flat (left erect; right flat but infant latches well to right)  Comfort (Breast/Nipple): Soft / non-tender     Hold (Positioning): Assistance needed to correctly position infant at breast and maintain latch. Intervention(s): Breastfeeding basics  reviewed;Skin to skin  LATCH Score: 6  Lactation Tools Discussed/Used WIC Program: Yes Initiated by:: RN Date initiated:: 05/03/14   Consult Status Consult Status: Follow-up Date: 05/04/14 Follow-up type: In-patient    Lendon KaVann, Vyla Pint Walker 05/03/2014, 9:00 PM

## 2014-05-03 NOTE — Progress Notes (Signed)
Fetal HR to 90's with recovery of 40-60 seconds. Patient position changed to left tilt for FHR recovery. Dr. Richarda BladeAdamo at bedside and reviewed tracing.

## 2014-05-03 NOTE — Anesthesia Preprocedure Evaluation (Addendum)
Anesthesia Evaluation  Patient identified by MRN, date of birth, ID band Patient awake    Reviewed: Allergy & Precautions, NPO status , Patient's Chart, lab work & pertinent test results  History of Anesthesia Complications Negative for: history of anesthetic complications  Airway Mallampati: II  TM Distance: >3 FB Neck ROM: Full    Dental no notable dental hx. (+) Dental Advisory Given   Pulmonary neg pulmonary ROS,  breath sounds clear to auscultation  Pulmonary exam normal       Cardiovascular negative cardio ROS  Rhythm:Regular Rate:Normal     Neuro/Psych negative neurological ROS  negative psych ROS   GI/Hepatic negative GI ROS, Neg liver ROS,   Endo/Other  obesity  Renal/GU negative Renal ROS  negative genitourinary   Musculoskeletal negative musculoskeletal ROS (+)   Abdominal   Peds negative pediatric ROS (+)  Hematology  (+) Blood dyscrasia, anemia ,   Anesthesia Other Findings   Reproductive/Obstetrics (+) Pregnancy                            Anesthesia Physical Anesthesia Plan  ASA: II  Anesthesia Plan: Epidural   Post-op Pain Management:    Induction:   Airway Management Planned:   Additional Equipment:   Intra-op Plan:   Post-operative Plan:   Informed Consent: I have reviewed the patients History and Physical, chart, labs and discussed the procedure including the risks, benefits and alternatives for the proposed anesthesia with the patient or authorized representative who has indicated his/her understanding and acceptance.   Dental advisory given  Plan Discussed with:   Anesthesia Plan Comments:         Anesthesia Quick Evaluation

## 2014-05-03 NOTE — Anesthesia Procedure Notes (Signed)
Epidural Patient location during procedure: OB Start time: 05/03/2014 5:55 AM  Staffing Anesthesiologist: Felipe DroneJUDD, Alexsandro Salek JENNETTE Performed by: anesthesiologist   Preanesthetic Checklist Completed: patient identified, site marked, surgical consent, pre-op evaluation, timeout performed, IV checked, risks and benefits discussed and monitors and equipment checked  Epidural Patient position: sitting Prep: site prepped and draped and DuraPrep Patient monitoring: continuous pulse ox and blood pressure Approach: midline Location: L3-L4 Injection technique: LOR saline  Needle:  Needle type: Tuohy  Needle gauge: 17 G Needle length: 9 cm and 9 Needle insertion depth: 5 cm cm Catheter type: closed end flexible Catheter size: 19 Gauge Catheter at skin depth: 10 cm Test dose: negative  Assessment Events: blood not aspirated, injection not painful, no injection resistance, negative IV test and no paresthesia  Additional Notes Patient identified. Risks/Benefits/Options discussed with patient including but not limited to bleeding, infection, nerve damage, paralysis, failed block, incomplete pain control, headache, blood pressure changes, nausea, vomiting, reactions to medication both or allergic, itching and postpartum back pain. Confirmed with bedside nurse the patient's most recent platelet count. Confirmed with patient that they are not currently taking any anticoagulation, have any bleeding history or any family history of bleeding disorders. Patient expressed understanding and wished to proceed. All questions were answered. Sterile technique was used throughout the entire procedure. Please see nursing notes for vital signs. Test dose was given through epidural catheter and negative prior to continuing to dose epidural or start infusion. Warning signs of high block given to the patient including shortness of breath, tingling/numbness in hands, complete motor block, or any concerning symptoms with  instructions to call for help. Patient was given instructions on fall risk and not to get out of bed. All questions and concerns addressed with instructions to call with any issues or inadequate analgesia.

## 2014-05-03 NOTE — Anesthesia Postprocedure Evaluation (Signed)
Anesthesia Post Note  Patient: Teresa BurkittMaurine Massey  Procedure(s) Performed: * No procedures listed *  Anesthesia type: Epidural  Patient location: Mother/Baby  Post pain: Pain level controlled  Post assessment: Post-op Vital signs reviewed  Last Vitals:  Filed Vitals:   05/03/14 1024  BP: 112/44  Pulse: 103  Temp: 36.8 C  Resp: 18    Post vital signs: Reviewed  Level of consciousness:alert  Complications: No apparent anesthesia complications

## 2014-05-03 NOTE — Progress Notes (Signed)
UR chart review completed.  

## 2014-05-03 NOTE — MAU Note (Signed)
Pt states she started having contractions at 1845.

## 2014-05-04 ENCOUNTER — Encounter (HOSPITAL_COMMUNITY): Payer: Self-pay | Admitting: *Deleted

## 2014-05-04 LAB — CBC
HEMATOCRIT: 32.3 % — AB (ref 36.0–46.0)
Hemoglobin: 11.2 g/dL — ABNORMAL LOW (ref 12.0–15.0)
MCH: 32.8 pg (ref 26.0–34.0)
MCHC: 34.7 g/dL (ref 30.0–36.0)
MCV: 94.7 fL (ref 78.0–100.0)
Platelets: 186 10*3/uL (ref 150–400)
RBC: 3.41 MIL/uL — ABNORMAL LOW (ref 3.87–5.11)
RDW: 14.1 % (ref 11.5–15.5)
WBC: 9.4 10*3/uL (ref 4.0–10.5)

## 2014-05-04 LAB — RPR: RPR Ser Ql: NONREACTIVE

## 2014-05-04 MED ORDER — IBUPROFEN 200 MG PO TABS: 600.0000 mg | ORAL_TABLET | Freq: Four times a day (QID) | ORAL | Status: DC | PRN

## 2014-05-04 MED ORDER — NORETHINDRONE 0.35 MG PO TABS: 1.0000 | ORAL_TABLET | Freq: Every day | ORAL | Status: DC

## 2014-05-04 NOTE — Discharge Summary (Signed)
Obstetric Discharge Summary Reason for Admission: onset of labor Prenatal Procedures: none Intrapartum Procedures: spontaneous vaginal delivery Postpartum Procedures: none Complications-Operative and Postpartum: 2nd degree perineal laceration HEMOGLOBIN  Date Value Ref Range Status  05/04/2014 11.2* 12.0 - 15.0 g/dL Final   HCT  Date Value Ref Range Status  05/04/2014 32.3* 36.0 - 46.0 % Final   Teresa Massey is a 34yo G2P1011 @ 39.0wks admitted for labor on 2/19. She progressed to SVD rather quickly, and by PPD#1 is doing well and desires an early discharge. She is breastfeeding and would like Micronor for contraception. She is going back home to the [place] on 3/10 and therefore will need an early PP visit as well as a year's supply of POPs to take back with her if possible (rx written for such).  Physical Exam:  General: alert, cooperative and no distress  Heart: RRR Lungs: nl effort Lochia: appropriate Uterine Fundus: firm DVT Evaluation: No evidence of DVT seen on physical exam.  Discharge Diagnoses: Term Pregnancy-delivered  Discharge Information: Date: 05/04/2014 Activity: pelvic rest Diet: routine Medications: PNV, Ibuprofen and Micronor Condition: stable Instructions: refer to practice specific booklet Discharge to: home Follow-up Information    [facility] Schedule an appointment as soon as possible for a visit in 3 weeks.   Why:  For your postpartum appointment.   Contact information:   [address] [place] [id number] [phone]      Newborn Data: Live born female  Birth Weight: 5 lb 15.9 oz (2720 g) APGAR: 8, 9  Home with mother.  Cam HaiSHAW, KIMBERLY CNM 05/04/2014, 9:43 AM

## 2014-05-04 NOTE — Discharge Instructions (Signed)

## 2014-05-05 ENCOUNTER — Ambulatory Visit: Payer: Self-pay

## 2014-05-05 NOTE — Lactation Note (Signed)
This note was copied from the chart of Teresa Massey. Lactation Consultation Note: Follow up visit with mom before DC. She reports that baby has been latching well. Her nipples are a little tender but no worse than yesterday. Has been giving some bottles of formula in addition to nursing. Reports that she has pumped a few times yesterday and did obtain some Colostrum. Plans to buy pump for home. Going back to the [place]. No questions at present. To call prn  Patient Name: Teresa Massey ZOXWR'UToday's Date: 05/05/2014 Reason for consult: Follow-up assessment;Breast surgery   Maternal Data Formula Feeding for Exclusion: No Reason for exclusion: Previous breast surgery (mastectomy, reduction, or augmentation where mother is unable to produce breast milk) Has patient been taught Hand Expression?: Yes Does the patient have breastfeeding experience prior to this delivery?: Yes  Feeding    LATCH Score/Interventions                      Lactation Tools Discussed/Used     Consult Status Consult Status: Complete    Pamelia HoitWeeks, Kacey Dysert D 05/05/2014, 8:16 AM

## 2014-05-07 ENCOUNTER — Encounter: Payer: Medicaid Other | Admitting: Advanced Practice Midwife

## 2014-06-19 ENCOUNTER — Ambulatory Visit: Payer: Medicaid Other | Admitting: Obstetrics & Gynecology

## 2015-03-16 DIAGNOSIS — O24419 Gestational diabetes mellitus in pregnancy, unspecified control: Secondary | ICD-10-CM

## 2015-03-16 HISTORY — DX: Gestational diabetes mellitus in pregnancy, unspecified control: O24.419

## 2015-03-16 NOTE — L&D Delivery Note (Signed)
Delivery Note At 9:24 PM a viable female was delivered via Vaginal, Spontaneous Delivery (Presentation: ROA  ).  APGAR: 8/9, ; weight  pending.    After 2 minutes, the cord was clamped and cut. 40 units of pitocin diluted in 1000cc LR was infused rapidly IV.  The placenta separated spontaneously and delivered via CCT and maternal pushing effort.  It was inspected and appears to be intact with a 3 VC.   Anesthesia:  epidural Episiotomy: None Lacerations:  none Suture Repair:  Est. Blood Loss (mL): 150  Mom to postpartum.  Baby to Couplet care / Skin to Skin.  Massey,Teresa Emrich 02/12/2016, 9:33 PM

## 2015-10-04 ENCOUNTER — Inpatient Hospital Stay (HOSPITAL_COMMUNITY)
Admission: AD | Admit: 2015-10-04 | Discharge: 2015-10-05 | Disposition: A | Discharge status: Home / Self Care | Payer: Medicaid Other | Source: Ambulatory Visit | Attending: Family Medicine | Admitting: Family Medicine

## 2015-10-04 ENCOUNTER — Encounter (HOSPITAL_COMMUNITY): Payer: Self-pay | Admitting: *Deleted

## 2015-10-04 ENCOUNTER — Inpatient Hospital Stay (HOSPITAL_COMMUNITY): Payer: Medicaid Other

## 2015-10-04 DIAGNOSIS — Z885 Allergy status to narcotic agent status: Secondary | ICD-10-CM | POA: Diagnosis not present

## 2015-10-04 DIAGNOSIS — O26892 Other specified pregnancy related conditions, second trimester: Secondary | ICD-10-CM | POA: Insufficient documentation

## 2015-10-04 DIAGNOSIS — O26899 Other specified pregnancy related conditions, unspecified trimester: Secondary | ICD-10-CM

## 2015-10-04 DIAGNOSIS — R110 Nausea: Secondary | ICD-10-CM | POA: Diagnosis not present

## 2015-10-04 DIAGNOSIS — Z3A20 20 weeks gestation of pregnancy: Secondary | ICD-10-CM | POA: Diagnosis not present

## 2015-10-04 DIAGNOSIS — R109 Unspecified abdominal pain: Secondary | ICD-10-CM | POA: Diagnosis present

## 2015-10-04 DIAGNOSIS — Z79899 Other long term (current) drug therapy: Secondary | ICD-10-CM | POA: Diagnosis not present

## 2015-10-04 DIAGNOSIS — O210 Mild hyperemesis gravidarum: Secondary | ICD-10-CM | POA: Diagnosis not present

## 2015-10-04 LAB — COMPREHENSIVE METABOLIC PANEL
ALT: 8 U/L — AB (ref 14–54)
AST: 11 U/L — ABNORMAL LOW (ref 15–41)
Albumin: 3.1 g/dL — ABNORMAL LOW (ref 3.5–5.0)
Alkaline Phosphatase: 81 U/L (ref 38–126)
Anion gap: 7 (ref 5–15)
BUN: 13 mg/dL (ref 6–20)
CHLORIDE: 104 mmol/L (ref 101–111)
CO2: 23 mmol/L (ref 22–32)
CREATININE: 0.52 mg/dL (ref 0.44–1.00)
Calcium: 9.1 mg/dL (ref 8.9–10.3)
GFR calc non Af Amer: >60 mL/min (ref >60)
Glucose, Bld: 98 mg/dL (ref 65–99)
Potassium: 3.7 mmol/L (ref 3.5–5.1)
Sodium: 134 mmol/L — ABNORMAL LOW (ref 135–145)
Total Bilirubin: 0.3 mg/dL (ref 0.3–1.2)
Total Protein: 6.7 g/dL (ref 6.5–8.1)

## 2015-10-04 LAB — WET PREP, GENITAL
CLUE CELLS WET PREP: NONE SEEN
Sperm: NONE SEEN
TRICH WET PREP: NONE SEEN
YEAST WET PREP: NONE SEEN

## 2015-10-04 LAB — CBC
HCT: 32.9 % — ABNORMAL LOW (ref 36.0–46.0)
Hemoglobin: 11.1 g/dL — ABNORMAL LOW (ref 12.0–15.0)
MCH: 29.5 pg (ref 26.0–34.0)
MCHC: 33.7 g/dL (ref 30.0–36.0)
MCV: 87.5 fL (ref 78.0–100.0)
PLATELETS: 261 10*3/uL (ref 150–400)
RBC: 3.76 MIL/uL — AB (ref 3.87–5.11)
RDW: 15.1 % (ref 11.5–15.5)
WBC: 10.2 10*3/uL (ref 4.0–10.5)

## 2015-10-04 LAB — URINALYSIS, ROUTINE W REFLEX MICROSCOPIC
BILIRUBIN URINE: NEGATIVE
Glucose, UA: NEGATIVE mg/dL
Hgb urine dipstick: NEGATIVE
Ketones, ur: NEGATIVE mg/dL
Leukocytes, UA: NEGATIVE
Nitrite: NEGATIVE
PH: 5.5 (ref 5.0–8.0)
Protein, ur: NEGATIVE mg/dL
Specific Gravity, Urine: 1.02 (ref 1.005–1.030)

## 2015-10-04 LAB — LIPASE, BLOOD: LIPASE: 28 U/L (ref 11–51)

## 2015-10-04 LAB — AMYLASE: Amylase: 63 U/L (ref 28–100)

## 2015-10-04 MED ORDER — PROMETHAZINE HCL 25 MG/ML IJ SOLN
12.5000 mg | Freq: Once | INTRAMUSCULAR | Status: DC
  Filled 2015-10-04: qty 1

## 2015-10-04 MED ORDER — FENTANYL CITRATE (PF) 100 MCG/2ML IJ SOLN
50.0000 ug | Freq: Once | INTRAMUSCULAR | Status: AC
  Administered 2015-10-04: 50 ug via INTRAMUSCULAR
  Filled 2015-10-04: qty 2

## 2015-10-04 NOTE — MAU Note (Signed)
Patient presents to mau with c/o lower abdominal pain on the right side that wraps around to her back. Denies LOF, VB at this time. +FM. Endorses living in [place], where she receives her prenatal care. Just arrived here today to visit.

## 2015-10-04 NOTE — MAU Provider Note (Signed)
History     CSN: [id number]  Arrival date and time: 10/04/15 2102   First Provider Initiated Contact with Patient 10/04/15 2222      Chief Complaint  Patient presents with  . Abdominal Pain   HPI Teresa Massey is a 35 y.o. [id number] at [redacted]w[redacted]d who presents with abdominal pain. Lives in the [place] & receives prenatal care there; here today visiting. Abdominal pain comes & goes x 2 weeks. Saw her OB on Thursday for pain; states he wanted to admit her to the hospital & monitor her for abruption, pt refused to be admitted b/c she wanted to "come back home". Pain is intermittent & occurs daily. When it comes it lasts for hours at a time. Currently rates pain 7/10. Took medication given to her by her OB without relief. Some nausea, no vomiting. No change in appetite. Denies fever/chills, vaginal bleeding, diarrhea, dysuria, constipation, LOF. Positive fetal movement.   OB History    Gravida Para Term Preterm AB TAB SAB Ectopic Multiple Living   4 2 2  1 1    0 2      Past Medical History  Diagnosis Date  . Kidney stones 2008, 2011  . Anemia   . Hx of cholecystectomy 2005  . Hx of bilateral breast reduction surgery 2009  . H/O eye surgery     bil "lazy" eyes    Past Surgical History  Procedure Laterality Date  . Breast reduction surgery  2009  . Cholecystectomy  2005  . 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 Asthma Father   . Hypertension Father     Social History  Substance Use Topics  . Smoking status: Never Smoker   . Smokeless tobacco: Never Used  . Alcohol Use: No    Allergies:  Allergies  Allergen Reactions  . Morphine Rash    Spreads from injection site/burning    Prescriptions prior to admission  Medication Sig Dispense Refill Last Dose  . ibuprofen (ADVIL) 200 MG tablet Take 3 tablets (600 mg total) by mouth every 6 (six) hours as needed. 50 tablet 1   . norethindrone (ORTHO MICRONOR) 0.35 MG  tablet Take 1 tablet (0.35 mg total) by mouth daily. Please allow pt to purchase a year's supply (pack #12) at once 1 Package 11   . Prenatal Vit-Fe Fumarate-FA (PRENATAL MULTIVITAMIN) TABS tablet Take 1 tablet by mouth daily at 12 noon.   05/02/2014 at Unknown time    Review of Systems  Constitutional: Negative.   Gastrointestinal: Positive for nausea and abdominal pain. Negative for vomiting, diarrhea and constipation.  Genitourinary: Negative.    Physical Exam   Blood pressure 132/78, pulse 78, temperature 97.9 F (36.6 C), temperature source Oral, resp. rate 17, weight 189 lb 1.9 oz (85.8 kg), SpO2 99 %, unknown if currently breastfeeding.  Physical Exam  Nursing note and vitals reviewed. Constitutional: She is oriented to person, place, and time. She appears well-developed and well-nourished. No distress.  HENT:  Head: Normocephalic and atraumatic.  Eyes: Conjunctivae are normal. Right eye exhibits no discharge. Left eye exhibits no discharge. No scleral icterus.  Neck: Normal range of motion.  Cardiovascular: Normal rate, regular rhythm and normal heart sounds.   No murmur heard. Respiratory: Effort normal and breath sounds normal. No respiratory distress. She has no wheezes.  GI: Soft. There is generalized tenderness. There is no rigidity, no rebound and  no guarding.  Neurological: She is alert and oriented to person, place, and time.  Skin: Skin is warm and dry. She is not diaphoretic.  Psychiatric: She has a normal mood and affect. Her behavior is normal. Judgment and thought content normal.   Dilation: Closed Effacement (%): Thick Cervical Position: Posterior Exam by:: Judeth Horn NP  MAU Course  Procedures Results for orders placed or performed during the hospital encounter of 10/04/15 (from the past 24 hour(s))  Urinalysis, Routine w reflex microscopic (not at [facility])     Status: None   Collection Time: 10/04/15  9:17 PM  Result Value Ref Range   Color, Urine YELLOW  YELLOW   APPearance CLEAR CLEAR   Specific Gravity, Urine 1.020 1.005 - 1.030   pH 5.5 5.0 - 8.0   Glucose, UA NEGATIVE NEGATIVE mg/dL   Hgb urine dipstick NEGATIVE NEGATIVE   Bilirubin Urine NEGATIVE NEGATIVE   Ketones, ur NEGATIVE NEGATIVE mg/dL   Protein, ur NEGATIVE NEGATIVE mg/dL   Nitrite NEGATIVE NEGATIVE   Leukocytes, UA NEGATIVE NEGATIVE  CBC     Status: Abnormal   Collection Time: 10/04/15 10:55 PM  Result Value Ref Range   WBC 10.2 4.0 - 10.5 K/uL   RBC 3.76 (L) 3.87 - 5.11 MIL/uL   Hemoglobin 11.1 (L) 12.0 - 15.0 g/dL   HCT 27.2 (L) 53.6 - 64.4 %   MCV 87.5 78.0 - 100.0 fL   MCH 29.5 26.0 - 34.0 pg   MCHC 33.7 30.0 - 36.0 g/dL   RDW 03.4 74.2 - 59.5 %   Platelets 261 150 - 400 K/uL  Comprehensive metabolic panel     Status: Abnormal   Collection Time: 10/04/15 10:55 PM  Result Value Ref Range   Sodium 134 (L) 135 - 145 mmol/L   Potassium 3.7 3.5 - 5.1 mmol/L   Chloride 104 101 - 111 mmol/L   CO2 23 22 - 32 mmol/L   Glucose, Bld 98 65 - 99 mg/dL   BUN 13 6 - 20 mg/dL   Creatinine, Ser 6.38 0.44 - 1.00 mg/dL   Calcium 9.1 8.9 - 75.6 mg/dL   Total Protein 6.7 6.5 - 8.1 g/dL   Albumin 3.1 (L) 3.5 - 5.0 g/dL   AST 11 (L) 15 - 41 U/L   ALT 8 (L) 14 - 54 U/L   Alkaline Phosphatase 81 38 - 126 U/L   Total Bilirubin 0.3 0.3 - 1.2 mg/dL   GFR calc non Af Amer >60 >60 mL/min   GFR calc Af Amer >60 >60 mL/min   Anion gap 7 5 - 15  Amylase     Status: None   Collection Time: 10/04/15 10:55 PM  Result Value Ref Range   Amylase 63 28 - 100 U/L  Lipase, blood     Status: None   Collection Time: 10/04/15 10:55 PM  Result Value Ref Range   Lipase 28 11 - 51 U/L  Wet prep, genital     Status: Abnormal   Collection Time: 10/04/15 11:12 PM  Result Value Ref Range   Yeast Wet Prep HPF POC NONE SEEN NONE SEEN   Trich, Wet Prep NONE SEEN NONE SEEN   Clue Cells Wet Prep HPF POC NONE SEEN NONE SEEN   WBC, Wet Prep HPF POC FEW (A) NONE SEEN   Sperm NONE SEEN    MDM FHT  163 by doppler Ultrasound shows SIUP at [redacted]w[redacted]d, normal placenta, CL 4 cm Cervix closed Fentanyl 50 mcg IM Phenergan 12.5  mg IM Pt reports improvement in symptoms  Assessment and Plan  A: 1. Pregnancy related nausea, antepartum   2. [redacted] weeks gestation of pregnancy   3. Abdominal pain affecting pregnancy    P; Discharge home Take tylenol prn pain per bottle instructions Comfort measures  Maternity support belt Discussed reasons to return to MAU Keep f/u with OB  Judeth Horn 10/04/2015, 10:21 PM

## 2015-10-05 DIAGNOSIS — O210 Mild hyperemesis gravidarum: Secondary | ICD-10-CM | POA: Diagnosis not present

## 2015-10-05 MED ORDER — PROMETHAZINE HCL 12.5 MG PO TABS: 12.5000 mg | ORAL_TABLET | Freq: Four times a day (QID) | ORAL | Status: DC | PRN

## 2015-10-05 MED ORDER — PROMETHAZINE HCL 25 MG/ML IJ SOLN
12.5000 mg | Freq: Once | INTRAMUSCULAR | Status: AC
  Administered 2015-10-05: 12.5 mg via INTRAMUSCULAR

## 2015-10-05 NOTE — Discharge Instructions (Signed)
Preterm Labor Information Preterm labor is when labor starts at less than 37 weeks of pregnancy. The normal length of a pregnancy is 39 to 41 weeks. CAUSES Often, there is no identifiable underlying cause as to why a woman goes into preterm labor. One of the most common known causes of preterm labor is infection. Infections of the uterus, cervix, vagina, amniotic sac, bladder, kidney, or even the lungs (pneumonia) can cause labor to start. Other suspected causes of preterm labor include:   Urogenital infections, such as yeast infections and bacterial vaginosis.   Uterine abnormalities (uterine shape, uterine septum, fibroids, or bleeding from the placenta).   A cervix that has been operated on (it may fail to stay closed).   Malformations in the fetus.   Multiple gestations (twins, triplets, and so on).   Breakage of the amniotic sac.  RISK FACTORS  Having a previous history of preterm labor.   Having premature rupture of membranes (PROM).   Having a placenta that covers the opening of the cervix (placenta previa).   Having a placenta that separates from the uterus (placental abruption).   Having a cervix that is too weak to hold the fetus in the uterus (incompetent cervix).   Having too much fluid in the amniotic sac (polyhydramnios).   Taking illegal drugs or smoking while pregnant.   Not gaining enough weight while pregnant.   Being younger than 65 and older than 33 years old.   Having a low socioeconomic status.   Being African American. SYMPTOMS Signs and symptoms of preterm labor include:   Menstrual-like cramps, abdominal pain, or back pain.  Uterine contractions that are regular, as frequent as six in an hour, regardless of their intensity (may be mild or painful).  Contractions that start on the top of the uterus and spread down to the lower abdomen and back.   A sense of increased pelvic pressure.   A watery or bloody mucus discharge that  comes from the vagina.  TREATMENT Depending on the length of the pregnancy and other circumstances, your health care provider may suggest bed rest. If necessary, there are medicines that can be given to stop contractions and to mature the fetal lungs. If labor happens before 34 weeks of pregnancy, a prolonged hospital stay may be recommended. Treatment depends on the condition of both you and the fetus.  WHAT SHOULD YOU DO IF YOU THINK YOU ARE IN PRETERM LABOR? Call your health care provider right away. You will need to go to the hospital to get checked immediately. HOW CAN YOU PREVENT PRETERM LABOR IN FUTURE PREGNANCIES? You should:   Stop smoking if you smoke.  Maintain healthy weight gain and avoid chemicals and drugs that are not necessary.  Be watchful for any type of infection.  Inform your health care provider if you have a known history of preterm labor.   This information is not intended to replace advice given to you by your health care provider. Make sure you discuss any questions you have with your health care provider.   Document Released: 05/22/2003 Document Revised: 11/01/2012 Document Reviewed: 04/03/2012 Elsevier Interactive Patient Education 2016 [employer].     [facility]   [facility] Ce[facility]      Phone: [phone]  [facility]     Phone: [phone]  [facility] [place]  Phone: [phone]  [facility] [place]  Phone: [phone]  [facility]    Phone:  [phone]   [facility])    Phone: [phone]  [facility]    Phone: [phone]  [facility]    Phone: [phone]  [facility]    Phone: [phone]  [facility] Phone: [phone]   [facility]  Phone: [phone]  [facility]    Phone: [phone]  [facility]   Phone: [phone]  [facility]      Phone: [phone]  [facility]    Phone: [phone]  [facility]     Phone: [phone]

## 2015-10-06 LAB — GC/CHLAMYDIA PROBE AMP (~~LOC~~) NOT AT ARMC
Chlamydia: NEGATIVE
Neisseria Gonorrhea: NEGATIVE

## 2015-10-14 ENCOUNTER — Ambulatory Visit (INDEPENDENT_AMBULATORY_CARE_PROVIDER_SITE_OTHER): Payer: Medicaid Other | Admitting: Family

## 2015-10-14 ENCOUNTER — Encounter: Payer: Self-pay | Admitting: Family

## 2015-10-14 ENCOUNTER — Other Ambulatory Visit: Payer: Self-pay | Admitting: Family

## 2015-10-14 VITALS — BP 119/75 | HR 100

## 2015-10-14 DIAGNOSIS — N898 Other specified noninflammatory disorders of vagina: Secondary | ICD-10-CM | POA: Diagnosis not present

## 2015-10-14 MED ORDER — ACYCLOVIR 200 MG PO CAPS: ORAL_CAPSULE | ORAL | 1 refills | Status: DC

## 2015-10-14 NOTE — Patient Instructions (Addendum)
Herpes Simplex Test °There are two common types of herpes simplex virus (HSV). These are classified as Type 1 (HSV1) or Type 2 (HSV2). Type 1 often causes cold sores on or around the mouth and sometimes on or around the eyes. Type 2 is commonly known as a sexually transmitted infection that causes sores in and around the genitals. Both types of herpes simplex can cause sores in different areas. °There are two types of herpes simplex tests. These include: °· Culture. This consists of collecting and testing a sample of fluid with a cotton swab from an open sore. This can only be done during an active infection (outbreak). Culture tests take several days to complete but are very accurate. °· HSV blood tests. This test is not as accurate as a culture. However, HSV blood tests are faster than cultures, often providing a test result within one day. °¨ HSV antibody test. This checks for the presence of antibodies against HSV in your blood. Antibodies are proteins your body makes to help fight infection. °¨ HSV antigen test. This checks for the presence of the HSV germ (antigen) in your blood. °Your health care provider may recommend you have a HSV test if: °· He or she believes you have a HSV infection. °· You have a weakened immune system (immunocompromised) and you have sores around your mouth or genitals that look like HSV eruptions. °· You have a fever of unknown origin (FUO). °· You are pregnant, have herpes, and are expecting to deliver a baby vaginally in the next 6-8 weeks. °RESULTS °It is your responsibility to obtain your test results. Ask the lab or department performing the test when and how you will get your results. Contact your health care provider to discuss any questions you have about your results. °Range of Normal Values °Ranges for normal values may vary among different labs and hospitals. You should always check with your health care provider after having lab work or other tests done to discuss whether  your values are considered within normal limits. Normal findings include: °· No HSV antigen or antibodies present in your blood. °· No HSV antigen present in cultured fluid. °Meaning of Results Outside Normal Ranges °The following test results may indicate that you have an active HSV infection: °· Positive culture for HSV1 or HSV2. °· Presence of HSV1 or HSV2 antigens in your blood. °· Presence of certain HSV1 or HSV2 antibodies (IgM) in your blood. °Discuss your test results with your health care provider. He or she will use the results to make a diagnosis and determine a treatment plan that is right for you. °  °This information is not intended to replace advice given to you by your health care provider. Make sure you discuss any questions you have with your health care provider. °  °Document Released: 04/03/2004 Document Revised: 03/22/2014 Document Reviewed: 07/17/2013 °Elsevier Interactive Patient Education ©2016 Elsevier Inc. ° °

## 2015-10-14 NOTE — Progress Notes (Signed)
   Subjective:    Patient ID: Teresa Massey, female    DOB: Jun 12, 1980, 35 y.o.   MRN: [id number]  HPI Pt is a [id number] here with report of vaginal lesion x 5 days that is getting progressively bigger.  Recently seen in MAU for preterm contractions.  STD tests negative (GC/CT, RPR, HIV, wet prep).  Receives prenatal care in the [place].  Prior care in this office.  Desires to have lesion examined before leaving tomorrow.     Review of Systems  Genitourinary: Positive for genital sores. Negative for hematuria, pelvic pain and vaginal discharge.  All other systems reviewed and are negative.      Objective:   Physical Exam  Constitutional: She is oriented to person, place, and time. She appears well-developed and well-nourished. No distress.  HENT:  Head: Normocephalic and atraumatic.  Neck: Normal range of motion. Neck supple. No thyromegaly present.  Abdominal: Bowel sounds are normal.  Genitourinary:     Neurological: She is alert and oriented to person, place, and time.  Skin: Skin is warm and dry.      Assessment & Plan:  Vaginal Lesion  Plan: HSV I&II labs HSV culture Wound culture obtained RX Acyclovir due to leaving country - patient plans to review results on MyChart  Jamee Pacholski Kennith Gain, CNM

## 2015-10-17 LAB — HERPES SIMPLEX VIRUS CULTURE: Organism ID, Bacteria: DETECTED

## 2015-10-18 ENCOUNTER — Encounter: Payer: Self-pay | Admitting: Family

## 2015-10-18 DIAGNOSIS — A6000 Herpesviral infection of urogenital system, unspecified: Secondary | ICD-10-CM | POA: Insufficient documentation

## 2015-10-18 DIAGNOSIS — A6009 Herpesviral infection of other urogenital tract: Secondary | ICD-10-CM | POA: Insufficient documentation

## 2015-10-18 DIAGNOSIS — O98319 Other infections with a predominantly sexual mode of transmission complicating pregnancy, unspecified trimester: Secondary | ICD-10-CM

## 2015-10-21 LAB — CULTURE, ROUTINE-GENITAL: ORGANISM ID, BACTERIA: NORMAL

## 2015-12-30 ENCOUNTER — Inpatient Hospital Stay (HOSPITAL_COMMUNITY)
Admission: AD | Admit: 2015-12-30 | Discharge: 2015-12-31 | Disposition: A | Discharge status: Home / Self Care | Payer: Medicaid Other | Source: Ambulatory Visit | Attending: Obstetrics & Gynecology | Admitting: Obstetrics & Gynecology

## 2015-12-30 ENCOUNTER — Encounter (HOSPITAL_COMMUNITY): Payer: Self-pay

## 2015-12-30 DIAGNOSIS — Z3A32 32 weeks gestation of pregnancy: Secondary | ICD-10-CM | POA: Diagnosis not present

## 2015-12-30 DIAGNOSIS — A6009 Herpesviral infection of other urogenital tract: Secondary | ICD-10-CM

## 2015-12-30 DIAGNOSIS — O98319 Other infections with a predominantly sexual mode of transmission complicating pregnancy, unspecified trimester: Secondary | ICD-10-CM

## 2015-12-30 LAB — URINALYSIS, ROUTINE W REFLEX MICROSCOPIC
BILIRUBIN URINE: NEGATIVE
Glucose, UA: NEGATIVE mg/dL
Ketones, ur: NEGATIVE mg/dL
Nitrite: NEGATIVE
PROTEIN: NEGATIVE mg/dL
Specific Gravity, Urine: 1.01 (ref 1.005–1.030)
pH: 6.5 (ref 5.0–8.0)

## 2015-12-30 LAB — URINE MICROSCOPIC-ADD ON

## 2015-12-30 MED ORDER — NIFEDIPINE 10 MG PO CAPS
10.0000 mg | ORAL_CAPSULE | Freq: Once | ORAL | Status: AC
  Administered 2015-12-30: 10 mg via ORAL
  Filled 2015-12-30: qty 1

## 2015-12-30 NOTE — MAU Provider Note (Signed)
MAU HISTORY AND PHYSICAL  Chief Complaint:  Contractions  Teresa Massey is a 35 y.o.  [id number]  at 6741w5d presenting for Contractions . Patient states she has been having  regular, every 2-3 minutes contractions, none vaginal bleeding, intact membranes, with active fetal movement.    Reports feeling contractions consistently since 3pm this afternoon. Feels "worn out" by them. They have been coming steadily every 2-3 minutes. Also reports some nausea, vomiting over past 1-2 days. Felt feverish but has not checked temperature at home. Has 2 children at home who have colds. She is in [place] [facility], has appointment on Thursday.  Past Medical History:  Diagnosis Date  . Anemia   . H/O eye surgery    bil "lazy" eyes  . Hx of bilateral breast reduction surgery 2009  . Hx of cholecystectomy 2005  . Kidney stones 2008, 2011    Past Surgical History:  Procedure Laterality Date  . BREAST REDUCTION SURGERY  2009  . CHOLECYSTECTOMY  2005  . EYE SURGERY     for a "lazy" eye (both eyes)    Family History  Problem Relation Age of Onset  . Asthma Father   . Hypertension Father     Social History  Substance Use Topics  . Smoking status: Never Smoker  . Smokeless tobacco: Never Used  . Alcohol use No    Allergies  Allergen Reactions  . Morphine Rash    Spreads from injection site/burning    Prescriptions Prior to Admission  Medication Sig Dispense Refill Last Dose  . Prenatal Vit-Fe Fumarate-FA (PRENATAL MULTIVITAMIN) TABS tablet Take 1 tablet by mouth daily at 12 noon.   12/30/2015 at Unknown time  . acyclovir (ZOVIRAX) 200 MG capsule Take two tablets three times a day for 5 days for each outbreak. 90 capsule 1 More than a month at Unknown time  . promethazine (PHENERGAN) 12.5 MG tablet Take 1 tablet (12.5 mg total) by mouth every 6 (six) hours as needed for nausea or vomiting. (Patient not taking: Reported on 10/14/2015) 30 tablet 0 Not Taking     Review of Systems - Negative except for what is mentioned in HPI.  Physical Exam  Blood pressure 132/74, pulse 117, temperature 97.6 F (36.4 C), temperature source Oral, resp. rate 18, height 5\' 4"  (1.626 m), weight 88 kg (194 lb), unknown if currently breastfeeding. GENERAL: Well-developed, well-nourished female in no acute distress.  LUNGS: No respiratory distress HEART: Regular rate ABDOMEN: Soft, nontender, nondistended, gravid abdomen.  EXTREMITIES: Nontender, no edema, 2+ distal pulses. FHT:  Baseline 145 Contractions: every 2-3 minutes   Dilation: Closed (external os 1cm) Effacement (%): 50 Exam by:: Ciscomber Stovall RN   Labs: Results for orders placed or performed during the hospital encounter of 12/30/15 (from the past 24 hour(s))  Urinalysis, Routine w reflex microscopic (not at [facility])   Collection Time: 12/30/15 10:50 PM  Result Value Ref Range   Color, Urine YELLOW YELLOW   APPearance CLEAR CLEAR   Specific Gravity, Urine 1.010 1.005 - 1.030   pH 6.5 5.0 - 8.0   Glucose, UA NEGATIVE NEGATIVE mg/dL   Hgb urine dipstick TRACE (A) NEGATIVE   Bilirubin Urine NEGATIVE NEGATIVE   Ketones, ur NEGATIVE NEGATIVE mg/dL   Protein, ur NEGATIVE NEGATIVE mg/dL   Nitrite NEGATIVE NEGATIVE   Leukocytes, UA TRACE (A) NEGATIVE  Urine microscopic-add on   Collection Time: 12/30/15 10:50 PM  Result Value Ref Range   Squamous Epithelial / LPF  0-5 (A) NONE SEEN   WBC, UA 0-5 0 - 5 WBC/hpf   RBC / HPF 0-5 0 - 5 RBC/hpf   Bacteria, UA RARE (A) NONE SEEN    Imaging Studies:  No results found.  Assessment: Teresa Massey is  35 y.o. [phone] at [redacted]w[redacted]d presents with Contractions . MDM U/A, urine culture sent FFN Procardia 10mg  x 3  Plan: Preterm contractions Likely secondary to viral infection Contractions improved with Procardia 10mg  x3 FFN negative  Reassurance provided Advised her to stay well hydrated Follow up as scheduled on 10/19  Tillman Sers, DO  PGY-1 10/17/201711:23 PM  OB FELLOW DISCHARGE ATTESTATION  I have seen and examined this patient and agree with above documentation in the resident's note.   Ernestina Penna, MD 9:14 AM

## 2015-12-30 NOTE — MAU Note (Signed)
Pt here with c/o contractions since about 1500 today. Pains in back and abdomen. Reports positive movement. Denies bleeding or leaking of fluid. Was seen here early August with some preterm contractions.

## 2015-12-31 LAB — FETAL FIBRONECTIN: Fetal Fibronectin: NEGATIVE

## 2015-12-31 MED ORDER — NIFEDIPINE 10 MG PO CAPS
10.0000 mg | ORAL_CAPSULE | Freq: Once | ORAL | Status: AC
  Administered 2015-12-31: 10 mg via ORAL
  Filled 2015-12-31: qty 1

## 2015-12-31 NOTE — Discharge Instructions (Signed)

## 2016-01-01 ENCOUNTER — Other Ambulatory Visit (HOSPITAL_COMMUNITY)
Admission: RE | Admit: 2016-01-01 | Discharge: 2016-01-01 | Disposition: A | Discharge status: Home / Self Care | Payer: Medicaid Other | Source: Ambulatory Visit | Attending: Family Medicine | Admitting: Family Medicine

## 2016-01-01 ENCOUNTER — Encounter: Payer: Self-pay | Admitting: Family Medicine

## 2016-01-01 ENCOUNTER — Ambulatory Visit (INDEPENDENT_AMBULATORY_CARE_PROVIDER_SITE_OTHER): Payer: Medicaid Other | Admitting: Family Medicine

## 2016-01-01 VITALS — BP 107/64 | HR 115 | Wt 200.0 lb

## 2016-01-01 DIAGNOSIS — Z3480 Encounter for supervision of other normal pregnancy, unspecified trimester: Secondary | ICD-10-CM

## 2016-01-01 DIAGNOSIS — Z113 Encounter for screening for infections with a predominantly sexual mode of transmission: Secondary | ICD-10-CM | POA: Diagnosis present

## 2016-01-01 DIAGNOSIS — Z3A33 33 weeks gestation of pregnancy: Secondary | ICD-10-CM

## 2016-01-01 DIAGNOSIS — A609 Anogenital herpesviral infection, unspecified: Secondary | ICD-10-CM

## 2016-01-01 DIAGNOSIS — A6009 Herpesviral infection of other urogenital tract: Secondary | ICD-10-CM

## 2016-01-01 DIAGNOSIS — O479 False labor, unspecified: Secondary | ICD-10-CM | POA: Insufficient documentation

## 2016-01-01 DIAGNOSIS — O98313 Other infections with a predominantly sexual mode of transmission complicating pregnancy, third trimester: Secondary | ICD-10-CM

## 2016-01-01 DIAGNOSIS — Z23 Encounter for immunization: Secondary | ICD-10-CM

## 2016-01-01 DIAGNOSIS — O0990 Supervision of high risk pregnancy, unspecified, unspecified trimester: Secondary | ICD-10-CM | POA: Insufficient documentation

## 2016-01-01 DIAGNOSIS — O98319 Other infections with a predominantly sexual mode of transmission complicating pregnancy, unspecified trimester: Secondary | ICD-10-CM

## 2016-01-01 DIAGNOSIS — O4703 False labor before 37 completed weeks of gestation, third trimester: Secondary | ICD-10-CM

## 2016-01-01 DIAGNOSIS — O4700 False labor before 37 completed weeks of gestation, unspecified trimester: Secondary | ICD-10-CM

## 2016-01-01 DIAGNOSIS — Z3483 Encounter for supervision of other normal pregnancy, third trimester: Secondary | ICD-10-CM

## 2016-01-01 LAB — CULTURE, OB URINE

## 2016-01-01 LAB — POCT URINALYSIS DIP (DEVICE)
BILIRUBIN URINE: NEGATIVE
Glucose, UA: NEGATIVE mg/dL
NITRITE: NEGATIVE
PH: 6 (ref 5.0–8.0)
PROTEIN: 30 mg/dL — AB
Specific Gravity, Urine: 1.02 (ref 1.005–1.030)
UROBILINOGEN UA: 0.2 mg/dL (ref 0.0–1.0)

## 2016-01-01 MED ORDER — NIFEDIPINE ER OSMOTIC RELEASE 30 MG PO TB24: 30.0000 mg | ORAL_TABLET | Freq: Every day | ORAL | 2 refills | Status: DC

## 2016-01-01 MED ORDER — VALACYCLOVIR HCL 1 G PO TABS: 500.0000 mg | ORAL_TABLET | Freq: Two times a day (BID) | ORAL | 2 refills | Status: DC

## 2016-01-01 MED ORDER — TETANUS-DIPHTH-ACELL PERTUSSIS 5-2.5-18.5 LF-MCG/0.5 IM SUSP
0.5000 mL | Freq: Once | INTRAMUSCULAR | Status: AC
  Administered 2016-01-01: 0.5 mL via INTRAMUSCULAR

## 2016-01-01 MED ORDER — TETANUS-DIPHTH-ACELL PERTUSSIS 5-2.5-18.5 LF-MCG/0.5 IM SUSP: 0.5000 mL | Freq: Once | INTRAMUSCULAR | Status: DC

## 2016-01-01 NOTE — Progress Notes (Signed)
Urine: trace ketones, trace blood, trace leukocytes Tdap today, declines flu Initial prenatal labs today Initial and prenatal education packet given

## 2016-01-01 NOTE — Patient Instructions (Signed)
Third Trimester of Pregnancy The third trimester is from week 29 through week 42, months 7 through 9. The third trimester is a time when the fetus is growing rapidly. At the end of the ninth month, the fetus is about 20 inches in length and weighs 6-10 pounds.  BODY CHANGES Your body goes through many changes during pregnancy. The changes vary from woman to woman.   Your weight will continue to increase. You can expect to gain 25-35 pounds (11-16 kg) by the end of the pregnancy.  You may begin to get stretch marks on your hips, abdomen, and breasts.  You may urinate more often because the fetus is moving lower into your pelvis and pressing on your bladder.  You may develop or continue to have heartburn as a result of your pregnancy.  You may develop constipation because certain hormones are causing the muscles that push waste through your intestines to slow down.  You may develop hemorrhoids or swollen, bulging veins (varicose veins).  You may have pelvic pain because of the weight gain and pregnancy hormones relaxing your joints between the bones in your pelvis. Backaches may result from overexertion of the muscles supporting your posture.  You may have changes in your hair. These can include thickening of your hair, rapid growth, and changes in texture. Some women also have hair loss during or after pregnancy, or hair that feels dry or thin. Your hair will most likely return to normal after your baby is born.  Your breasts will continue to grow and be tender. A yellow discharge may leak from your breasts called colostrum.  Your belly button may stick out.  You may feel short of breath because of your expanding uterus.  You may notice the fetus "dropping," or moving lower in your abdomen.  You may have a bloody mucus discharge. This usually occurs a few days to a week before labor begins.  Your cervix becomes thin and soft (effaced) near your due date. WHAT TO EXPECT AT YOUR  PRENATAL EXAMS  You will have prenatal exams every 2 weeks until week 36. Then, you will have weekly prenatal exams. During a routine prenatal visit:  You will be weighed to make sure you and the fetus are growing normally.  Your blood pressure is taken.  Your abdomen will be measured to track your baby's growth.  The fetal heartbeat will be listened to.  Any test results from the previous visit will be discussed.  You may have a cervical check near your due date to see if you have effaced. At around 36 weeks, your caregiver will check your cervix. At the same time, your caregiver will also perform a test on the secretions of the vaginal tissue. This test is to determine if a type of bacteria, Group B streptococcus, is present. Your caregiver will explain this further. Your caregiver may ask you:  What your birth plan is.  How you are feeling.  If you are feeling the baby move.  If you have had any abnormal symptoms, such as leaking fluid, bleeding, severe headaches, or abdominal cramping.  If you are using any tobacco products, including cigarettes, chewing tobacco, and electronic cigarettes.  If you have any questions. Other tests or screenings that may be performed during your third trimester include:  Blood tests that check for low iron levels (anemia).  Fetal testing to check the health, activity level, and growth of the fetus. Testing is done if you have certain medical conditions or if   there are problems during the pregnancy.  HIV (human immunodeficiency virus) testing. If you are at high risk, you may be screened for HIV during your third trimester of pregnancy. FALSE LABOR You may feel small, irregular contractions that eventually go away. These are called Braxton Hicks contractions, or false labor. Contractions may last for hours, days, or even weeks before true labor sets in. If contractions come at regular intervals, intensify, or become painful, it is best to be seen  by your caregiver.  SIGNS OF LABOR   Menstrual-like cramps.  Contractions that are 5 minutes apart or less.  Contractions that start on the top of the uterus and spread down to the lower abdomen and back.  A sense of increased pelvic pressure or back pain.  A watery or bloody mucus discharge that comes from the vagina. If you have any of these signs before the 37th week of pregnancy, call your caregiver right away. You need to go to the hospital to get checked immediately. HOME CARE INSTRUCTIONS   Avoid all smoking, herbs, alcohol, and unprescribed drugs. These chemicals affect the formation and growth of the baby.  Do not use any tobacco products, including cigarettes, chewing tobacco, and electronic cigarettes. If you need help quitting, ask your health care provider. You may receive counseling support and other resources to help you quit.  Follow your caregiver's instructions regarding medicine use. There are medicines that are either safe or unsafe to take during pregnancy.  Exercise only as directed by your caregiver. Experiencing uterine cramps is a good sign to stop exercising.  Continue to eat regular, healthy meals.  Wear a good support bra for breast tenderness.  Do not use hot tubs, steam rooms, or saunas.  Wear your seat belt at all times when driving.  Avoid raw meat, uncooked cheese, cat litter boxes, and soil used by cats. These carry germs that can cause birth defects in the baby.  Take your prenatal vitamins.  Take 1500-2000 mg of calcium daily starting at the 20th week of pregnancy until you deliver your baby.  Try taking a stool softener (if your caregiver approves) if you develop constipation. Eat more high-fiber foods, such as fresh vegetables or fruit and whole grains. Drink plenty of fluids to keep your urine clear or pale yellow.  Take warm sitz baths to soothe any pain or discomfort caused by hemorrhoids. Use hemorrhoid cream if your caregiver  approves.  If you develop varicose veins, wear support hose. Elevate your feet for 15 minutes, 3-4 times a day. Limit salt in your diet.  Avoid heavy lifting, wear low heal shoes, and practice good posture.  Rest a lot with your legs elevated if you have leg cramps or low back pain.  Visit your dentist if you have not gone during your pregnancy. Use a soft toothbrush to brush your teeth and be gentle when you floss.  A sexual relationship may be continued unless your caregiver directs you otherwise.  Do not travel far distances unless it is absolutely necessary and only with the approval of your caregiver.  Take prenatal classes to understand, practice, and ask questions about the labor and delivery.  Make a trial run to the hospital.  Pack your hospital bag.  Prepare the baby's nursery.  Continue to go to all your prenatal visits as directed by your caregiver. SEEK MEDICAL CARE IF:  You are unsure if you are in labor or if your water has broken.  You have dizziness.  You have   mild pelvic cramps, pelvic pressure, or nagging pain in your abdominal area.  You have persistent nausea, vomiting, or diarrhea.  You have a bad smelling vaginal discharge.  You have pain with urination. SEEK IMMEDIATE MEDICAL CARE IF:   You have a fever.  You are leaking fluid from your vagina.  You have spotting or bleeding from your vagina.  You have severe abdominal cramping or pain.  You have rapid weight loss or gain.  You have shortness of breath with chest pain.  You notice sudden or extreme swelling of your face, hands, ankles, feet, or legs.  You have not felt your baby move in over an hour.  You have severe headaches that do not go away with medicine.  You have vision changes.   This information is not intended to replace advice given to you by your health care provider. Make sure you discuss any questions you have with your health care provider.   Document Released:  02/23/2001 Document Revised: 03/22/2014 Document Reviewed: 05/02/2012 Elsevier Interactive Patient Education 2016 [employer]  Breastfeeding Deciding to breastfeed is one of the best choices you can make for you and your baby. A change in hormones during pregnancy causes your breast tissue to grow and increases the number and size of your milk ducts. These hormones also allow proteins, sugars, and fats from your blood supply to make breast milk in your milk-producing glands. Hormones prevent breast milk from being released before your baby is born as well as prompt milk flow after birth. Once breastfeeding has begun, thoughts of your baby, as well as his or her sucking or crying, can stimulate the release of milk from your milk-producing glands.  BENEFITS OF BREASTFEEDING For Your Baby  Your first milk (colostrum) helps your baby's digestive system function better.  There are antibodies in your milk that help your baby fight off infections.  Your baby has a lower incidence of asthma, allergies, and sudden infant death syndrome.  The nutrients in breast milk are better for your baby than infant formulas and are designed uniquely for your baby's needs.  Breast milk improves your baby's brain development.  Your baby is less likely to develop other conditions, such as childhood obesity, asthma, or type 2 diabetes mellitus. For You  Breastfeeding helps to create a very special bond between you and your baby.  Breastfeeding is convenient. Breast milk is always available at the correct temperature and costs nothing.  Breastfeeding helps to burn calories and helps you lose the weight gained during pregnancy.  Breastfeeding makes your uterus contract to its prepregnancy size faster and slows bleeding (lochia) after you give birth.   Breastfeeding helps to lower your risk of developing type 2 diabetes mellitus, osteoporosis, and breast or ovarian cancer later in life. SIGNS THAT YOUR BABY IS  HUNGRY Early Signs of Hunger  Increased alertness or activity.  Stretching.  Movement of the head from side to side.  Movement of the head and opening of the mouth when the corner of the mouth or cheek is stroked (rooting).  Increased sucking sounds, smacking lips, cooing, sighing, or squeaking.  Hand-to-mouth movements.  Increased sucking of fingers or hands. Late Signs of Hunger  Fussing.  Intermittent crying. Extreme Signs of Hunger Signs of extreme hunger will require calming and consoling before your baby will be able to breastfeed successfully. Do not wait for the following signs of extreme hunger to occur before you initiate breastfeeding:  Restlessness.  A loud, strong cry.  Screaming.   BREASTFEEDING BASICS Breastfeeding Initiation  Find a comfortable place to sit or lie down, with your neck and back well supported.  Place a pillow or rolled up blanket under your baby to bring him or her to the level of your breast (if you are seated). Nursing pillows are specially designed to help support your arms and your baby while you breastfeed.  Make sure that your baby's abdomen is facing your abdomen.  Gently massage your breast. With your fingertips, massage from your chest wall toward your nipple in a circular motion. This encourages milk flow. You may need to continue this action during the feeding if your milk flows slowly.  Support your breast with 4 fingers underneath and your thumb above your nipple. Make sure your fingers are well away from your nipple and your baby's mouth.  Stroke your baby's lips gently with your finger or nipple.  When your baby's mouth is open wide enough, quickly bring your baby to your breast, placing your entire nipple and as much of the colored area around your nipple (areola) as possible into your baby's mouth.  More areola should be visible above your baby's upper lip than below the lower lip.  Your baby's tongue should be between his  or her lower gum and your breast.  Ensure that your baby's mouth is correctly positioned around your nipple (latched). Your baby's lips should create a seal on your breast and be turned out (everted).  It is common for your baby to suck about 2-3 minutes in order to start the flow of breast milk. Latching Teaching your baby how to latch on to your breast properly is very important. An improper latch can cause nipple pain and decreased milk supply for you and poor weight gain in your baby. Also, if your baby is not latched onto your nipple properly, he or she may swallow some air during feeding. This can make your baby fussy. Burping your baby when you switch breasts during the feeding can help to get rid of the air. However, teaching your baby to latch on properly is still the best way to prevent fussiness from swallowing air while breastfeeding. Signs that your baby has successfully latched on to your nipple:  Silent tugging or silent sucking, without causing you pain.  Swallowing heard between every 3-4 sucks.  Muscle movement above and in front of his or her ears while sucking. Signs that your baby has not successfully latched on to nipple:  Sucking sounds or smacking sounds from your baby while breastfeeding.  Nipple pain. If you think your baby has not latched on correctly, slip your finger into the corner of your baby's mouth to break the suction and place it between your baby's gums. Attempt breastfeeding initiation again. Signs of Successful Breastfeeding Signs from your baby:  A gradual decrease in the number of sucks or complete cessation of sucking.  Falling asleep.  Relaxation of his or her body.  Retention of a small amount of milk in his or her mouth.  Letting go of your breast by himself or herself. Signs from you:  Breasts that have increased in firmness, weight, and size 1-3 hours after feeding.  Breasts that are softer immediately after  breastfeeding.  Increased milk volume, as well as a change in milk consistency and color by the fifth day of breastfeeding.  Nipples that are not sore, cracked, or bleeding. Signs That Your Baby is Getting Enough Milk  Wetting at least 3 diapers in a 24-hour period.   The urine should be clear and pale yellow by age 5 days.  At least 3 stools in a 24-hour period by age 5 days. The stool should be soft and yellow.  At least 3 stools in a 24-hour period by age 7 days. The stool should be seedy and yellow.  No loss of weight greater than 10% of birth weight during the first 3 days of age.  Average weight gain of 4-7 ounces (113-198 g) per week after age 4 days.  Consistent daily weight gain by age 5 days, without weight loss after the age of 2 weeks. After a feeding, your baby may spit up a small amount. This is common. BREASTFEEDING FREQUENCY AND DURATION Frequent feeding will help you make more milk and can prevent sore nipples and breast engorgement. Breastfeed when you feel the need to reduce the fullness of your breasts or when your baby shows signs of hunger. This is called "breastfeeding on demand." Avoid introducing a pacifier to your baby while you are working to establish breastfeeding (the first 4-6 weeks after your baby is born). After this time you may choose to use a pacifier. Research has shown that pacifier use during the first year of a baby's life decreases the risk of sudden infant death syndrome (SIDS). Allow your baby to feed on each breast as long as he or she wants. Breastfeed until your baby is finished feeding. When your baby unlatches or falls asleep while feeding from the first breast, offer the second breast. Because newborns are often sleepy in the first few weeks of life, you may need to awaken your baby to get him or her to feed. Breastfeeding times will vary from baby to baby. However, the following rules can serve as a guide to help you ensure that your baby is  properly fed:  Newborns (babies 4 weeks of age or younger) may breastfeed every 1-3 hours.  Newborns should not go longer than 3 hours during the day or 5 hours during the night without breastfeeding.  You should breastfeed your baby a minimum of 8 times in a 24-hour period until you begin to introduce solid foods to your baby at around 6 months of age. BREAST MILK PUMPING Pumping and storing breast milk allows you to ensure that your baby is exclusively fed your breast milk, even at times when you are unable to breastfeed. This is especially important if you are going back to work while you are still breastfeeding or when you are not able to be present during feedings. Your lactation consultant can give you guidelines on how long it is safe to store breast milk. A breast pump is a machine that allows you to pump milk from your breast into a sterile bottle. The pumped breast milk can then be stored in a refrigerator or freezer. Some breast pumps are operated by hand, while others use electricity. Ask your lactation consultant which type will work best for you. Breast pumps can be purchased, but some hospitals and breastfeeding support groups lease breast pumps on a monthly basis. A lactation consultant can teach you how to hand express breast milk, if you prefer not to use a pump. CARING FOR YOUR BREASTS WHILE YOU BREASTFEED Nipples can become dry, cracked, and sore while breastfeeding. The following recommendations can help keep your breasts moisturized and healthy:  Avoid using soap on your nipples.  Wear a supportive bra. Although not required, special nursing bras and tank tops are designed to allow access to your   breasts for breastfeeding without taking off your entire bra or top. Avoid wearing underwire-style bras or extremely tight bras.  Air dry your nipples for 3-4minutes after each feeding.  Use only cotton bra pads to absorb leaked breast milk. Leaking of breast milk between feedings  is normal.  Use lanolin on your nipples after breastfeeding. Lanolin helps to maintain your skin's normal moisture barrier. If you use pure lanolin, you do not need to wash it off before feeding your baby again. Pure lanolin is not toxic to your baby. You may also hand express a few drops of breast milk and gently massage that milk into your nipples and allow the milk to air dry. In the first few weeks after giving birth, some women experience extremely full breasts (engorgement). Engorgement can make your breasts feel heavy, warm, and tender to the touch. Engorgement peaks within 3-5 days after you give birth. The following recommendations can help ease engorgement:  Completely empty your breasts while breastfeeding or pumping. You may want to start by applying warm, moist heat (in the shower or with warm water-soaked hand towels) just before feeding or pumping. This increases circulation and helps the milk flow. If your baby does not completely empty your breasts while breastfeeding, pump any extra milk after he or she is finished.  Wear a snug bra (nursing or regular) or tank top for 1-2 days to signal your body to slightly decrease milk production.  Apply ice packs to your breasts, unless this is too uncomfortable for you.  Make sure that your baby is latched on and positioned properly while breastfeeding. If engorgement persists after 48 hours of following these recommendations, contact your health care provider or a lactation consultant. OVERALL HEALTH CARE RECOMMENDATIONS WHILE BREASTFEEDING  Eat healthy foods. Alternate between meals and snacks, eating 3 of each per day. Because what you eat affects your breast milk, some of the foods may make your baby more irritable than usual. Avoid eating these foods if you are sure that they are negatively affecting your baby.  Drink milk, fruit juice, and water to satisfy your thirst (about 10 glasses a day).  Rest often, relax, and continue to take  your prenatal vitamins to prevent fatigue, stress, and anemia.  Continue breast self-awareness checks.  Avoid chewing and smoking tobacco. Chemicals from cigarettes that pass into breast milk and exposure to secondhand smoke may harm your baby.  Avoid alcohol and drug use, including marijuana. Some medicines that may be harmful to your baby can pass through breast milk. It is important to ask your health care provider before taking any medicine, including all over-the-counter and prescription medicine as well as vitamin and herbal supplements. It is possible to become pregnant while breastfeeding. If birth control is desired, ask your health care provider about options that will be safe for your baby. SEEK MEDICAL CARE IF:  You feel like you want to stop breastfeeding or have become frustrated with breastfeeding.  You have painful breasts or nipples.  Your nipples are cracked or bleeding.  Your breasts are red, tender, or warm.  You have a swollen area on either breast.  You have a fever or chills.  You have nausea or vomiting.  You have drainage other than breast milk from your nipples.  Your breasts do not become full before feedings by the fifth day after you give birth.  You feel sad and depressed.  Your baby is too sleepy to eat well.  Your baby is having trouble sleeping.     Your baby is wetting less than 3 diapers in a 24-hour period.  Your baby has less than 3 stools in a 24-hour period.  Your baby's skin or the white part of his or her eyes becomes yellow.   Your baby is not gaining weight by 5 days of age. SEEK IMMEDIATE MEDICAL CARE IF:  Your baby is overly tired (lethargic) and does not want to wake up and feed.  Your baby develops an unexplained fever.   This information is not intended to replace advice given to you by your health care provider. Make sure you discuss any questions you have with your health care provider.   Document Released: 03/01/2005  Document Revised: 11/20/2014 Document Reviewed: 08/23/2012 Elsevier Interactive Patient Education 2016 [employer] Inc[employer]  

## 2016-01-01 NOTE — Progress Notes (Signed)
    Subjective:    Teresa Massey is a [id number] being seen today for her first obstetrical visit.  Her obstetrical history is not significant. Patient does intend to breast feed. Pregnancy history fully reviewed. [place] in [place], has ultrasound from there. No labs noted. Reports normal pap in 08/2015.  Patient reports contractions since second trimester--seen in [facility] and given Procardia, neg FFN.  There were no vitals filed for this visit.  HISTORY: OB History  Gravida Para Term Preterm AB Living  4 2 2   1 2   SAB TAB Ectopic Multiple Live Births    1   0 2    # Outcome Date GA Lbr Len/2nd Weight Sex Delivery Anes PTL Lv  4 Current           3 Term 05/03/14 7833w0d 11:15 / 00:02 5 lb 15.9 oz (2.72 kg) F Vag-Spont EPI  LIV  2 Term 11/05/06 220w0d  6 lb 7 oz (2.92 kg) M Vag-Spont EPI N LIV  1 TAB 2003             Past Medical History:  Diagnosis Date  . Anemia   . Genital herpes   . H/O eye surgery    bil "lazy" eyes  . Hx of bilateral breast reduction surgery 2009  . Hx of cholecystectomy 2005  . Kidney stones 2008, 2011   Past Surgical History:  Procedure Laterality Date  . BREAST REDUCTION SURGERY  2009  . CHOLECYSTECTOMY  2005  . EYE SURGERY     for a "lazy" eye (both eyes)   Family History  Problem Relation Age of Onset  . Asthma Father   . Hypertension Father   . Cancer Paternal Grandmother     pancreatic cancer     Exam    Uterus:   FH 34  Pelvic Exam: SVE: closed, long, anterior   Perineum: No Hemorrhoids, Normal Perineum   Vulva: Bartholin's, Urethra, Skene's normal   Vagina:  normal mucosa   Cervix: multiparous appearance   Adnexa: normal adnexa   Bony Pelvis: average   Skin: normal coloration and turgor, no rashes    Neurologic: normal   Extremities: normal strength, tone, and muscle mass   HEENT sclera clear, anicteric   Mouth/Teeth dental hygiene good   Neck supple   Cardiovascular: regular rate and rhythm   Respiratory:  appears  well no respiratory distress   Abdomen: soft, non-tender; bowel sounds normal; no masses,  no organomegaly      Assessment/Plan:   1. Encounter for supervision of other normal pregnancy in third trimester  - Glucose Tolerance, 1 HR (50g) - Prenatal Profile - Cystic fibrosis diagnostic study - Culture, OB Urine - Pain Mgmt, Profile 6 Conf w/o mM, U - POCT urinalysis dip (device) - Tdap (BOOSTRIX) injection 0.5 mL; Inject 0.5 mLs into the muscle once.  2. Genital herpes affecting pregnancy, antepartum Suppression started - valACYclovir (VALTREX) 1000 MG tablet; Take 0.5 tablets (500 mg total) by mouth 2 (two) times daily.  Dispense: 60 tablet; Refill: 2  3. Preterm contractions Trial of Nifedipine - NIFEdipine (PROCARDIA XL) 30 MG 24 hr tablet; Take 1 tablet (30 mg total) by mouth daily.  Dispense: 60 tablet; Refill: 2   Teresa Massey 01/01/2016

## 2016-01-02 LAB — PRENATAL PROFILE (SOLSTAS)
Antibody Screen: NEGATIVE
BASOS ABS: 0 {cells}/uL (ref 0–200)
Basophils Relative: 0 %
EOS ABS: 102 {cells}/uL (ref 15–500)
EOS PCT: 1 %
HCT: 34.5 % — ABNORMAL LOW (ref 35.0–45.0)
HEMOGLOBIN: 11.3 g/dL — AB (ref 11.7–15.5)
HEP B S AG: NEGATIVE
HIV 1&2 Ab, 4th Generation: NONREACTIVE
LYMPHS ABS: 816 {cells}/uL — AB (ref 850–3900)
Lymphocytes Relative: 8 %
MCH: 30.3 pg (ref 27.0–33.0)
MCHC: 32.8 g/dL (ref 32.0–36.0)
MCV: 92.5 fL (ref 80.0–100.0)
MPV: 9.3 fL (ref 7.5–12.5)
Monocytes Absolute: 612 cells/uL (ref 200–950)
Monocytes Relative: 6 %
NEUTROS PCT: 85 %
Neutro Abs: 8670 cells/uL — ABNORMAL HIGH (ref 1500–7800)
PLATELETS: 218 10*3/uL (ref 140–400)
RBC: 3.73 MIL/uL — ABNORMAL LOW (ref 3.80–5.10)
RDW: 13.7 % (ref 11.0–15.0)
RUBELLA: 1.29 {index} — AB (ref <0.90)
Rh Type: POSITIVE
WBC: 10.2 10*3/uL (ref 3.8–10.8)

## 2016-01-02 LAB — GC/CHLAMYDIA PROBE AMP (~~LOC~~) NOT AT ARMC
Chlamydia: NEGATIVE
NEISSERIA GONORRHEA: NEGATIVE

## 2016-01-02 LAB — GLUCOSE TOLERANCE, 1 HOUR (50G) W/O FASTING: GLUCOSE, 1 HR, GESTATIONAL: 152 mg/dL — AB (ref <140)

## 2016-01-03 LAB — CULTURE, OB URINE: ORGANISM ID, BACTERIA: NO GROWTH

## 2016-01-04 ENCOUNTER — Encounter (HOSPITAL_COMMUNITY): Payer: Self-pay | Admitting: Certified Nurse Midwife

## 2016-01-04 ENCOUNTER — Inpatient Hospital Stay (HOSPITAL_COMMUNITY)
Admission: AD | Admit: 2016-01-04 | Discharge: 2016-01-04 | Disposition: A | Discharge status: Home / Self Care | Payer: Medicaid Other | Source: Ambulatory Visit | Attending: Obstetrics and Gynecology | Admitting: Obstetrics and Gynecology

## 2016-01-04 DIAGNOSIS — O26893 Other specified pregnancy related conditions, third trimester: Secondary | ICD-10-CM | POA: Diagnosis present

## 2016-01-04 DIAGNOSIS — O23593 Infection of other part of genital tract in pregnancy, third trimester: Secondary | ICD-10-CM | POA: Insufficient documentation

## 2016-01-04 DIAGNOSIS — R102 Pelvic and perineal pain: Secondary | ICD-10-CM | POA: Diagnosis not present

## 2016-01-04 DIAGNOSIS — Z3A33 33 weeks gestation of pregnancy: Secondary | ICD-10-CM | POA: Diagnosis not present

## 2016-01-04 DIAGNOSIS — B9689 Other specified bacterial agents as the cause of diseases classified elsewhere: Secondary | ICD-10-CM | POA: Insufficient documentation

## 2016-01-04 DIAGNOSIS — N760 Acute vaginitis: Secondary | ICD-10-CM

## 2016-01-04 DIAGNOSIS — N949 Unspecified condition associated with female genital organs and menstrual cycle: Secondary | ICD-10-CM

## 2016-01-04 LAB — WET PREP, GENITAL
Sperm: NONE SEEN
TRICH WET PREP: NONE SEEN
Yeast Wet Prep HPF POC: NONE SEEN

## 2016-01-04 MED ORDER — METRONIDAZOLE 500 MG PO TABS: 500.0000 mg | ORAL_TABLET | Freq: Two times a day (BID) | ORAL | 0 refills | Status: DC

## 2016-01-04 NOTE — MAU Note (Signed)
Pt states she feels "like something is coming out". Pt states she has had increased pelvic pressure since this AM. Pt states she "felt inside and didn't have to go in very far and it was hard". Pt denies LOF or vaginal bleeding. Fetus is active.

## 2016-01-04 NOTE — MAU Provider Note (Signed)
History    Ms. Alla Germanierre is a 35 yo @ 33.3 wks G4P2012 who presents with "something hard in between my legs and pressure." Pt denies any VB, or LOF. +FM CSN: [id number]  Arrival date and time: 01/04/16 1607   None     Chief Complaint  Patient presents with  . Abdominal Pain  . Pelvic pressure   HPI  OB History    Gravida Para Term Preterm AB Living   4 2 2   1 2    SAB TAB Ectopic Multiple Live Births     1   0 2      Past Medical History:  Diagnosis Date  . Anemia   . Genital herpes   . H/O eye surgery    bil "lazy" eyes  . Hx of bilateral breast reduction surgery 2009  . Hx of cholecystectomy 2005  . Kidney stones 2008, 2011  . Spinal headache     Past Surgical History:  Procedure Laterality Date  . BREAST REDUCTION SURGERY  2009  . CHOLECYSTECTOMY  2005  . EYE SURGERY     for a "lazy" eye (both eyes)    Family History  Problem Relation Age of Onset  . Asthma Father   . Hypertension Father   . Cancer Paternal Grandmother     pancreatic cancer    Social History  Substance Use Topics  . Smoking status: Never Smoker  . Smokeless tobacco: Never Used  . Alcohol use No    Allergies:  Allergies  Allergen Reactions  . Morphine Rash    Spreads from injection site/burning    Prescriptions Prior to Admission  Medication Sig Dispense Refill Last Dose  . NIFEdipine (PROCARDIA XL) 30 MG 24 hr tablet Take 1 tablet (30 mg total) by mouth daily. 60 tablet 2   . Prenatal Vit-Fe Fumarate-FA (PRENATAL MULTIVITAMIN) TABS tablet Take 1 tablet by mouth daily at 12 noon.   Taking  . valACYclovir (VALTREX) 1000 MG tablet Take 0.5 tablets (500 mg total) by mouth 2 (two) times daily. 60 tablet 2     Review of Systems  Constitutional: Negative.   HENT: Negative.   Eyes: Negative.   Respiratory: Negative.   Cardiovascular: Negative.   Gastrointestinal: Negative.   Genitourinary: Negative.   Musculoskeletal: Negative.   Skin: Negative.   Neurological: Negative.    Endo/Heme/Allergies: Negative.   Psychiatric/Behavioral: Negative.    Physical Exam   Blood pressure 132/75, pulse 118, temperature 98.9 F (37.2 C), temperature source Oral, resp. rate 18, last menstrual period 05/14/2015, unknown if currently breastfeeding.  Physical Exam  Constitutional: She is oriented to person, place, and time. She appears well-developed and well-nourished.  HENT:  Head: Normocephalic and atraumatic.  Eyes: Conjunctivae are normal. Pupils are equal, round, and reactive to light.  Neck: Normal range of motion. Neck supple.  Cardiovascular: Normal rate and regular rhythm.   Respiratory: Effort normal and breath sounds normal.  GI: Soft. Bowel sounds are normal.  Genitourinary: Vagina normal and uterus normal.  Musculoskeletal: Normal range of motion.  Neurological: She is alert and oriented to person, place, and time.  Skin: Skin is warm and dry.  Psychiatric: She has a normal mood and affect. Her behavior is normal. Judgment and thought content normal.    MAU Course  Procedures  Wet Prep GC/CMZ cx    Assessment and Plan  1. IUP @ 33.3 wks  2. Vaginal Pressure  3. Bacterial Vaginosis   Plan: Will obtain wet prep- bacterial vaginosis ,  And GC/CMZ. Upon examination, pt informed of feeling urethra and inside of vaginal opening. Will discharge home , f/u with routine OBV.  Wyvonnia Dusky 01/04/2016, 4:25 PM

## 2016-01-04 NOTE — Discharge Instructions (Signed)

## 2016-01-05 LAB — GC/CHLAMYDIA PROBE AMP (~~LOC~~) NOT AT ARMC
CHLAMYDIA, DNA PROBE: NEGATIVE
Neisseria Gonorrhea: NEGATIVE

## 2016-01-06 ENCOUNTER — Other Ambulatory Visit: Payer: Medicaid Other

## 2016-01-06 DIAGNOSIS — O24419 Gestational diabetes mellitus in pregnancy, unspecified control: Secondary | ICD-10-CM

## 2016-01-06 LAB — CYSTIC FIBROSIS DIAGNOSTIC STUDY

## 2016-01-06 NOTE — Progress Notes (Unsigned)
Pt reports having headaches 3-4 times weekly. She denies visual disturbances or swelling. Tylenol is not helping the pain. Her blood pressure today is 107/67. I advised her that I would speak with a provider this afternoon and let her know if we can prescribe anything else for her headaches. Also advised her that if she develops swelling or visual disturbances she should come to MAU. Patient agreeable to this. Spoke with Dr. Genevie AnnSchenk who stated she could have fioricet 5 tablets and if no improvement she can go to MAU.

## 2016-01-07 LAB — GLUCOSE TOLERANCE, 3 HOURS
GLUCOSE, 1 HOUR-GESTATIONAL: 151 mg/dL (ref <190)
GLUCOSE, 2 HOUR-GESTATIONAL: 120 mg/dL (ref <165)
GLUCOSE, FASTING-GESTATIONAL: 96 mg/dL (ref 65–104)
Glucose, GTT - 3 Hour: 149 mg/dL — ABNORMAL HIGH (ref <145)

## 2016-01-08 LAB — PAIN MGMT, PROFILE 6 CONF W/O MM, U
6 Acetylmorphine: NEGATIVE ng/mL (ref <10)
ALCOHOL METABOLITES: NEGATIVE ng/mL (ref <500)
ALPHAHYDROXYMIDAZOLAM: NEGATIVE ng/mL (ref <50)
AMPHETAMINES: NEGATIVE ng/mL (ref <500)
Alphahydroxyalprazolam: NEGATIVE ng/mL (ref <25)
Alphahydroxytriazolam: NEGATIVE ng/mL (ref <50)
Aminoclonazepam: NEGATIVE ng/mL (ref <25)
BARBITURATES: NEGATIVE ng/mL (ref <300)
BENZODIAZEPINES: NEGATIVE ng/mL (ref <100)
CREATININE: 136.2 mg/dL (ref >=20.0)
Cocaine Metabolite: NEGATIVE ng/mL (ref <150)
HYDROXYETHYLFLURAZEPAM: NEGATIVE ng/mL (ref <50)
LORAZEPAM: NEGATIVE ng/mL (ref <50)
Marijuana Metabolite: NEGATIVE ng/mL (ref <20)
Methadone Metabolite: NEGATIVE ng/mL (ref <100)
NORDIAZEPAM: NEGATIVE ng/mL (ref <50)
OPIATES: NEGATIVE ng/mL (ref <100)
OXIDANT: NEGATIVE ug/mL (ref <200)
OXYCODONE: NEGATIVE ng/mL (ref <100)
Oxazepam: NEGATIVE ng/mL (ref <50)
PH: 6.96 (ref 4.5–9.0)
PHENCYCLIDINE: NEGATIVE ng/mL (ref <25)
PLEASE NOTE: 0
Temazepam: NEGATIVE ng/mL (ref <50)

## 2016-01-09 ENCOUNTER — Encounter: Payer: Self-pay | Admitting: Family

## 2016-01-09 DIAGNOSIS — O24419 Gestational diabetes mellitus in pregnancy, unspecified control: Secondary | ICD-10-CM | POA: Insufficient documentation

## 2016-01-12 ENCOUNTER — Encounter: Payer: Self-pay | Admitting: General Practice

## 2016-01-12 ENCOUNTER — Other Ambulatory Visit: Payer: Self-pay | Admitting: General Practice

## 2016-01-12 DIAGNOSIS — O24419 Gestational diabetes mellitus in pregnancy, unspecified control: Secondary | ICD-10-CM

## 2016-01-12 MED ORDER — ACCU-CHEK AVIVA PLUS W/DEVICE KIT: 1.0000 | PACK | Freq: Once | 0 refills | Status: AC

## 2016-01-12 MED ORDER — ACCU-CHEK SOFTCLIX LANCET DEV MISC: 0 refills | Status: DC

## 2016-01-12 MED ORDER — GLUCOSE BLOOD VI STRP: ORAL_STRIP | 12 refills | Status: DC

## 2016-01-12 MED ORDER — BUTALBITAL-APAP-CAFFEINE 50-325-40 MG PO TABS: 1.0000 | ORAL_TABLET | Freq: Four times a day (QID) | ORAL | 0 refills | Status: DC | PRN

## 2016-01-13 ENCOUNTER — Telehealth: Payer: Self-pay | Admitting: *Deleted

## 2016-01-13 ENCOUNTER — Ambulatory Visit (INDEPENDENT_AMBULATORY_CARE_PROVIDER_SITE_OTHER): Payer: Medicaid Other | Admitting: Obstetrics and Gynecology

## 2016-01-13 VITALS — BP 111/62 | HR 111 | Wt 205.0 lb

## 2016-01-13 DIAGNOSIS — R55 Syncope and collapse: Secondary | ICD-10-CM

## 2016-01-13 DIAGNOSIS — R42 Dizziness and giddiness: Secondary | ICD-10-CM

## 2016-01-13 DIAGNOSIS — O24419 Gestational diabetes mellitus in pregnancy, unspecified control: Secondary | ICD-10-CM

## 2016-01-13 DIAGNOSIS — O0990 Supervision of high risk pregnancy, unspecified, unspecified trimester: Secondary | ICD-10-CM

## 2016-01-13 LAB — BASIC METABOLIC PANEL
BUN: 6 mg/dL — AB (ref 7–25)
CHLORIDE: 106 mmol/L (ref 98–110)
CO2: 19 mmol/L — ABNORMAL LOW (ref 20–31)
Calcium: 9.2 mg/dL (ref 8.6–10.2)
Creat: 0.55 mg/dL (ref 0.50–1.10)
Glucose, Bld: 91 mg/dL (ref 65–99)
POTASSIUM: 4 mmol/L (ref 3.5–5.3)
SODIUM: 138 mmol/L (ref 135–146)

## 2016-01-13 LAB — CBC
HEMATOCRIT: 36.7 % (ref 35.0–45.0)
Hemoglobin: 12.1 g/dL (ref 11.7–15.5)
MCH: 30.6 pg (ref 27.0–33.0)
MCHC: 33 g/dL (ref 32.0–36.0)
MCV: 92.7 fL (ref 80.0–100.0)
MPV: 10 fL (ref 7.5–12.5)
PLATELETS: 246 10*3/uL (ref 140–400)
RBC: 3.96 MIL/uL (ref 3.80–5.10)
RDW: 13.8 % (ref 11.0–15.0)
WBC: 11.2 10*3/uL — AB (ref 3.8–10.8)

## 2016-01-13 LAB — POCT URINALYSIS DIP (DEVICE)
Bilirubin Urine: NEGATIVE
GLUCOSE, UA: NEGATIVE mg/dL
Hgb urine dipstick: NEGATIVE
Leukocytes, UA: NEGATIVE
NITRITE: NEGATIVE
PH: 6.5 (ref 5.0–8.0)
PROTEIN: NEGATIVE mg/dL
Specific Gravity, Urine: 1.02 (ref 1.005–1.030)
UROBILINOGEN UA: 0.2 mg/dL (ref 0.0–1.0)

## 2016-01-13 NOTE — Patient Instructions (Signed)
Near-Syncope Near-syncope (commonly known as near fainting) is sudden weakness, dizziness, or feeling like you might pass out. During an episode of near-syncope, you may also develop pale skin, have tunnel vision, or feel sick to your stomach (nauseous). Near-syncope may occur when getting up after sitting or while standing for a long time. It is caused by a sudden decrease in blood flow to the brain. This decrease can result from various causes or triggers, most of which are not serious. However, because near-syncope can sometimes be a sign of something serious, a medical evaluation is required. The specific cause is often not determined. HOME CARE INSTRUCTIONS  Monitor your condition for any changes. The following actions may help to alleviate any discomfort you are experiencing:  Have someone stay with you until you feel stable.  Lie down right away and prop your feet up if you start feeling like you might faint. Breathe deeply and steadily. Wait until all the symptoms have passed. Most of these episodes last only a few minutes. You may feel tired for several hours.   Drink enough fluids to keep your urine clear or pale yellow.   If you are taking blood pressure or heart medicine, get up slowly when seated or lying down. Take several minutes to sit and then stand. This can reduce dizziness.  Follow up with your health care provider as directed. SEEK IMMEDIATE MEDICAL CARE IF:   You have a severe headache.   You have unusual pain in the chest, abdomen, or back.   You are bleeding from the mouth or rectum, or you have black or tarry stool.   You have an irregular or very fast heartbeat.   You have repeated fainting or have seizure-like jerking during an episode.   You faint when sitting or lying down.   You have confusion.   You have difficulty walking.   You have severe weakness.   You have vision problems.  MAKE SURE YOU:   Understand these instructions.  Will  watch your condition.  Will get help right away if you are not doing well or get worse.   This information is not intended to replace advice given to you by your health care provider. Make sure you discuss any questions you have with your health care provider.   Document Released: 03/01/2005 Document Revised: 03/06/2013 Document Reviewed: 08/04/2012 Elsevier Interactive Patient Education 2016 Elsevier Inc. Basic Carbohydrate Counting for Diabetes Mellitus Carbohydrate counting is a method for keeping track of the amount of carbohydrates you eat. Eating carbohydrates naturally increases the level of sugar (glucose) in your blood, so it is important for you to know the amount that is okay for you to have in every meal. Carbohydrate counting helps keep the level of glucose in your blood within normal limits. The amount of carbohydrates allowed is different for every person. A dietitian can help you calculate the amount that is right for you. Once you know the amount of carbohydrates you can have, you can count the carbohydrates in the foods you want to eat. Carbohydrates are found in the following foods:  Grains, such as breads and cereals.  Dried beans and soy products.  Starchy vegetables, such as potatoes, peas, and corn.  Fruit and fruit juices.  Milk and yogurt.  Sweets and snack foods, such as cake, cookies, candy, chips, soft drinks, and fruit drinks. CARBOHYDRATE COUNTING There are two ways to count the carbohydrates in your food. You can use either of the methods or a combination  of both. Reading the "Nutrition Facts" on Packaged Food The "Nutrition Facts" is an area that is included on the labels of almost all packaged food and beverages in the [place]. It includes the serving size of that food or beverage and information about the nutrients in each serving of the food, including the grams (g) of carbohydrate per serving.  Decide the number of servings of this food or  beverage that you will be able to eat or drink. Multiply that number of servings by the number of grams of carbohydrate that is listed on the label for that serving. The total will be the amount of carbohydrates you will be having when you eat or drink this food or beverage. Learning Standard Serving Sizes of Food When you eat food that is not packaged or does not include "Nutrition Facts" on the label, you need to measure the servings in order to count the amount of carbohydrates.A serving of most carbohydrate-rich foods contains about 15 g of carbohydrates. The following list includes serving sizes of carbohydrate-rich foods that provide 15 g ofcarbohydrate per serving:   1 slice of bread (1 oz) or 1 six-inch tortilla.    of a hamburger bun or English muffin.  4-6 crackers.   cup unsweetened dry cereal.    cup hot cereal.   cup rice or pasta.    cup mashed potatoes or  of a large baked potato.  1 cup fresh fruit or one small piece of fruit.    cup canned or frozen fruit or fruit juice.  1 cup milk.   cup plain fat-free yogurt or yogurt sweetened with artificial sweeteners.   cup cooked dried beans or starchy vegetable, such as peas, corn, or potatoes.  Decide the number of standard-size servings that you will eat. Multiply that number of servings by 15 (the grams of carbohydrates in that serving). For example, if you eat 2 cups of strawberries, you will have eaten 2 servings and 30 g of carbohydrates (2 servings x 15 g = 30 g). For foods such as soups and casseroles, in which more than one food is mixed in, you will need to count the carbohydrates in each food that is included. EXAMPLE OF CARBOHYDRATE COUNTING Sample Dinner  3 oz chicken breast.   cup of brown rice.   cup of corn.  1 cup milk.   1 cup strawberries with sugar-free whipped topping.  Carbohydrate Calculation Step 1: Identify the foods that contain carbohydrates:   Rice.   Corn.    Milk.   Strawberries. Step 2:Calculate the number of servings eaten of each:   2 servings of rice.   1 serving of corn.   1 serving of milk.   1 serving of strawberries. Step 3: Multiply each of those number of servings by 15 g:   2 servings of rice x 15 g = 30 g.   1 serving of corn x 15 g = 15 g.   1 serving of milk x 15 g = 15 g.   1 serving of strawberries x 15 g = 15 g. Step 4: Add together all of the amounts to find the total grams of carbohydrates eaten: 30 g + 15 g + 15 g + 15 g = 75 g.   This information is not intended to replace advice given to you by your health care provider. Make sure you discuss any questions you have with your health care provider.   Document Released: 03/01/2005 Document Revised: 03/22/2014  Document Reviewed: 01/26/2013 Elsevier Interactive Patient Education [employer].

## 2016-01-13 NOTE — Progress Notes (Addendum)
   PRENATAL VISIT NOTE  Subjective:  Teresa Massey is a 35 y.o. [phone] at 4742w5d being seen today for ongoing prenatal care.  She is currently monitored for the following issues for this high-risk pregnancy and has Genital herpes; Genital herpes affecting pregnancy, antepartum; Supervision of high risk pregnancy, antepartum; Preterm contractions; and Gestational diabetes mellitus (GDM) affecting pregnancy on her problem list. [place] but here staying with parents until after delivery. Newly diagnosed GDM and has appointment with diabetic counselor and will then start CBG checks. Patient reports 4 day history of intermittent dizzy spells, feeling faint, transient headache. No CP or palpitations. Having late gestational discomforts and fatigue. Contractions: Irregular.    Movement: Present. Denies leaking of fluid.   The following portions of the patient's history were reviewed and updated as appropriate: allergies, current medications, past family history, past medical history, past social history, past surgical history and problem list. Problem list updated.  Objective:   Vitals:   01/13/16 1629  BP: 111/62  Pulse: (!) 111  Weight: 205 lb (93 kg)    Fetal Status: Fetal Heart Rate (bpm): 141   Movement: Present     General:  Alert, oriented and cooperative. Patient is in no acute distress.  Skin: Skin is warm and dry. No rash noted.   Cardiovascular: Normal heart rate noted  Respiratory: Normal respiratory effort, no problems with respiration noted  Abdomen: Soft, gravid, appropriate for gestational age. Pain/Pressure: Present     Pelvic:  Cervical exam deferred        Extremities: Normal range of motion.     Mental Status: Normal mood and affect. Normal behavior. Normal judgment and thought content.   Assessment and Plan:  Pregnancy: [id number] at 2342w5d   1. Supervision of high risk pregnancy, antepartum Postural dizziness with presyncope - Plan: CBC, Basic Metabolic Panel  (BMET)  Supervision of high risk pregnancy, antepartum - Plan: CBC, Basic Metabolic Panel (BMET)   2. Postural dizziness with presyncope  - CBC - Basic Metabolic Panel (BMET) Advised slow position changes, increase  Fluids, small frequent meals  3. GDM Advised small frequent meals, no simple sugars. F/U with diabetes counseling next week  Preterm labor symptoms and general obstetric precautions including but not limited to vaginal bleeding, contractions, leaking of fluid and fetal movement were reviewed in detail with the patient. Please refer to After Visit Summary for other counseling recommendations.  Return in about 1 week (around 01/20/2016).  Danae Orleanseirdre C Zyron Deeley, CNM

## 2016-01-13 NOTE — Telephone Encounter (Signed)
Pt left message stating that she is having sx of dizziness and trouble "processing" as well as difficulty having conversations. I called pt and discussed her concerns.  She states she has been feeling badly for several days. The problem does not change after eating. I offered appt today for 3pm and pt accepted.

## 2016-01-19 ENCOUNTER — Ambulatory Visit (INDEPENDENT_AMBULATORY_CARE_PROVIDER_SITE_OTHER): Payer: Medicaid Other | Admitting: Obstetrics & Gynecology

## 2016-01-19 ENCOUNTER — Other Ambulatory Visit (HOSPITAL_COMMUNITY)
Admission: RE | Admit: 2016-01-19 | Discharge: 2016-01-19 | Disposition: A | Discharge status: Home / Self Care | Payer: Medicaid Other | Source: Ambulatory Visit | Attending: Obstetrics & Gynecology | Admitting: Obstetrics & Gynecology

## 2016-01-19 ENCOUNTER — Ambulatory Visit: Payer: Medicaid Other | Admitting: *Deleted

## 2016-01-19 VITALS — BP 130/72 | HR 118 | Wt 201.6 lb

## 2016-01-19 DIAGNOSIS — O24419 Gestational diabetes mellitus in pregnancy, unspecified control: Secondary | ICD-10-CM

## 2016-01-19 DIAGNOSIS — O0990 Supervision of high risk pregnancy, unspecified, unspecified trimester: Secondary | ICD-10-CM

## 2016-01-19 DIAGNOSIS — Z113 Encounter for screening for infections with a predominantly sexual mode of transmission: Secondary | ICD-10-CM

## 2016-01-19 LAB — POCT URINALYSIS DIP (DEVICE)
Bilirubin Urine: NEGATIVE
Glucose, UA: NEGATIVE mg/dL
HGB URINE DIPSTICK: NEGATIVE
Ketones, ur: NEGATIVE mg/dL
NITRITE: NEGATIVE
PROTEIN: NEGATIVE mg/dL
SPECIFIC GRAVITY, URINE: 1.025 (ref 1.005–1.030)
UROBILINOGEN UA: 0.2 mg/dL (ref 0.0–1.0)
pH: 6 (ref 5.0–8.0)

## 2016-01-19 LAB — OB RESULTS CONSOLE GC/CHLAMYDIA: Gonorrhea: NEGATIVE

## 2016-01-19 LAB — OB RESULTS CONSOLE GBS: STREP GROUP B AG: NEGATIVE

## 2016-01-19 MED ORDER — BUTALBITAL-APAP-CAFFEINE 50-325-40 MG PO TABS: 1.0000 | ORAL_TABLET | Freq: Four times a day (QID) | ORAL | 0 refills | Status: AC | PRN

## 2016-01-19 NOTE — Progress Notes (Signed)
This is a 3rd pregnancy, (36 weeks)., but the first with gestational diabetes.  We reviewed what this was, and how it is affecting the baby.  She reported good understanding of this.  We also discussed the need to limit the amount of carbohydrate with each meal.  Typical day: 6AM:  8 ounces orange juice 7AM:  Coffee with 2 sugars 10AM: cereal- honey bunches of oates with milk, or yogurt and fruit parfait.  1PM: sandwich with water.  Denies chips or pretzels 7PM: 3-4 ounces protein, with 1 starchy veg., and 1 nonstarchy veg.  No bread, water to drink, or occasional sweet tea. Denies eating between meals or after supper.  Plan.   She was given a handout on gestational diabetes.  We discussed what carbohydrates were, and she was given a sheet with portion sizes.  She was told to limit the carbs at breakfast to 2 servings, or 30grams, and lunch and supper to 45.  She was told that she can have 15 grams between meals or ate bedtime if she gets hungry.  She was given a copy of this diet on a yellow card with the list of carb sizes on them.  She brought her accu-Chek meter with her, but has not used it as yet.  She was shown how to use the meter, and tested her blood sugars using good sterile techique.  Her blood sugar was 148, 45 min. After cereal and milk. She was given a log book and told to test acB and 2hr. pc meals.  She had no final questions.

## 2016-01-19 NOTE — Patient Instructions (Signed)
1.  Limit carbohydrate servings to 2 at breakfast, and 2-3 at lunch and supper, with 1 between meals and at bedtime.   2.  Test blood sugars before breakfast, and 2hr. After all meal.   Write readings in a log book, and bring to each visit. 3.  Reduce orange juice to 4 ounces.   4.  Stop sugar in coffee.

## 2016-01-19 NOTE — Progress Notes (Signed)
   PRENATAL VISIT NOTE  Subjective:  Teresa Massey is a 35 y.o. [phone] at 4653w4d being seen today for ongoing prenatal care.  She is currently monitored for the following issues for this high-risk pregnancy and has Genital herpes; Genital herpes affecting pregnancy, antepartum; Supervision of high risk pregnancy, antepartum; Preterm contractions; and Gestational diabetes mellitus (GDM) affecting pregnancy on her problem list.  Patient reports no complaints.  Contractions: Irregular. Vag. Bleeding: None.  Movement: Present. Denies leaking of fluid.   The following portions of the patient's history were reviewed and updated as appropriate: allergies, current medications, past family history, past medical history, past social history, past surgical history and problem list. Problem list updated.  Objective:   Vitals:   01/19/16 1250  BP: 130/72  Pulse: (!) 118  Weight: 201 lb 9.6 oz (91.4 kg)    Fetal Status: Fetal Heart Rate (bpm): 143   Movement: Present     General:  Alert, oriented and cooperative. Patient is in no acute distress.  Skin: Skin is warm and dry. No rash noted.   Cardiovascular: Normal heart rate noted  Respiratory: Normal respiratory effort, no problems with respiration noted  Abdomen: Soft, gravid, appropriate for gestational age. Pain/Pressure: Present     Pelvic:  cx examined  Extremities: Normal range of motion.  Edema: Trace  Mental Status: Normal mood and affect. Normal behavior. Normal judgment and thought content.   Assessment and Plan:  Pregnancy: [id number] at 6453w4d  1. Supervision of high risk pregnancy, antepartum  - GC/Chlamydia probe amp ([place] - Culture, beta strep (group b only)  2. Gestational diabetes mellitus (GDM) affecting pregnancy Started BG testing and was given diet  - GC[facility]Chlamydia probe amp ([place] - Culture, beta strep (group b only)  3. Gestational diabetes mellitus (GDM) in second trimester,  gestational diabetes method of control unspecified Needs headache medication - butalbital-acetaminophen-caffeine (FIORICET, ESGIC) 50-325-40 MG tablet; Take 1-2 tablets by mouth every 6 (six) hours as needed for headache.  Dispense: 20 tablet; Refill: 0  Preterm labor symptoms and general obstetric precautions including but not limited to vaginal bleeding, contractions, leaking of fluid and fetal movement were reviewed in detail with the patient. Please refer to After Visit Summary for other counseling recommendations.  Return in about 1 week (around 01/26/2016).   Adam PhenixJames G Regena Delucchi, MD

## 2016-01-20 ENCOUNTER — Inpatient Hospital Stay (HOSPITAL_COMMUNITY)
Admission: AD | Admit: 2016-01-20 | Discharge: 2016-01-20 | Disposition: A | Discharge status: Home / Self Care | Payer: Medicaid Other | Source: Ambulatory Visit | Attending: Family Medicine | Admitting: Family Medicine

## 2016-01-20 ENCOUNTER — Encounter (HOSPITAL_COMMUNITY): Payer: Self-pay

## 2016-01-20 DIAGNOSIS — O26893 Other specified pregnancy related conditions, third trimester: Secondary | ICD-10-CM

## 2016-01-20 DIAGNOSIS — O24419 Gestational diabetes mellitus in pregnancy, unspecified control: Secondary | ICD-10-CM

## 2016-01-20 DIAGNOSIS — R51 Headache: Secondary | ICD-10-CM

## 2016-01-20 DIAGNOSIS — Z885 Allergy status to narcotic agent status: Secondary | ICD-10-CM | POA: Diagnosis not present

## 2016-01-20 DIAGNOSIS — Z3A35 35 weeks gestation of pregnancy: Secondary | ICD-10-CM | POA: Insufficient documentation

## 2016-01-20 DIAGNOSIS — A6009 Herpesviral infection of other urogenital tract: Secondary | ICD-10-CM

## 2016-01-20 DIAGNOSIS — O98319 Other infections with a predominantly sexual mode of transmission complicating pregnancy, unspecified trimester: Secondary | ICD-10-CM

## 2016-01-20 DIAGNOSIS — O0990 Supervision of high risk pregnancy, unspecified, unspecified trimester: Secondary | ICD-10-CM

## 2016-01-20 LAB — BASIC METABOLIC PANEL
ANION GAP: 9 (ref 5–15)
BUN: 6 mg/dL (ref 6–20)
CALCIUM: 8.8 mg/dL — AB (ref 8.9–10.3)
CO2: 21 mmol/L — ABNORMAL LOW (ref 22–32)
CREATININE: 0.45 mg/dL (ref 0.44–1.00)
Chloride: 106 mmol/L (ref 101–111)
GFR calc Af Amer: >60 mL/min (ref >60)
GLUCOSE: 96 mg/dL (ref 65–99)
Potassium: 3.8 mmol/L (ref 3.5–5.1)
Sodium: 136 mmol/L (ref 135–145)

## 2016-01-20 LAB — URINALYSIS, ROUTINE W REFLEX MICROSCOPIC
Bilirubin Urine: NEGATIVE
GLUCOSE, UA: NEGATIVE mg/dL
Hgb urine dipstick: NEGATIVE
KETONES UR: 15 mg/dL — AB
LEUKOCYTES UA: NEGATIVE
NITRITE: NEGATIVE
PH: 6.5 (ref 5.0–8.0)
Protein, ur: NEGATIVE mg/dL
SPECIFIC GRAVITY, URINE: 1.01 (ref 1.005–1.030)

## 2016-01-20 LAB — CBC
HCT: 34.6 % — ABNORMAL LOW (ref 36.0–46.0)
Hemoglobin: 11.9 g/dL — ABNORMAL LOW (ref 12.0–15.0)
MCH: 31.3 pg (ref 26.0–34.0)
MCHC: 34.4 g/dL (ref 30.0–36.0)
MCV: 91.1 fL (ref 78.0–100.0)
PLATELETS: 238 10*3/uL (ref 150–400)
RBC: 3.8 MIL/uL — ABNORMAL LOW (ref 3.87–5.11)
RDW: 14.5 % (ref 11.5–15.5)
WBC: 10.1 10*3/uL (ref 4.0–10.5)

## 2016-01-20 LAB — GC/CHLAMYDIA PROBE AMP (~~LOC~~) NOT AT ARMC
Chlamydia: NEGATIVE
NEISSERIA GONORRHEA: NEGATIVE

## 2016-01-20 MED ORDER — LACTATED RINGERS IV BOLUS (SEPSIS)
1000.0000 mL | Freq: Once | INTRAVENOUS | Status: AC
  Administered 2016-01-20: 1000 mL via INTRAVENOUS

## 2016-01-20 MED ORDER — METOCLOPRAMIDE HCL 5 MG/ML IJ SOLN
10.0000 mg | Freq: Once | INTRAMUSCULAR | Status: AC
  Administered 2016-01-20: 10 mg via INTRAVENOUS
  Filled 2016-01-20: qty 2

## 2016-01-20 MED ORDER — DIPHENHYDRAMINE HCL 50 MG/ML IJ SOLN
25.0000 mg | Freq: Once | INTRAMUSCULAR | Status: AC
  Administered 2016-01-20: 25 mg via INTRAVENOUS
  Filled 2016-01-20: qty 1

## 2016-01-20 MED ORDER — ACETAMINOPHEN 500 MG PO TABS: 1000.0000 mg | ORAL_TABLET | Freq: Once | ORAL | Status: DC

## 2016-01-20 MED ORDER — HYDROMORPHONE HCL 1 MG/ML IJ SOLN
0.5000 mg | Freq: Once | INTRAMUSCULAR | Status: AC
  Administered 2016-01-20: 0.5 mg via INTRAVENOUS
  Filled 2016-01-20: qty 1

## 2016-01-20 MED ORDER — DEXAMETHASONE SODIUM PHOSPHATE 10 MG/ML IJ SOLN
10.0000 mg | Freq: Once | INTRAMUSCULAR | Status: AC
  Administered 2016-01-20: 10 mg via INTRAVENOUS
  Filled 2016-01-20: qty 1

## 2016-01-20 MED ORDER — BUTALBITAL-APAP-CAFFEINE 50-325-40 MG PO TABS
1.0000 | ORAL_TABLET | Freq: Once | ORAL | Status: AC
  Administered 2016-01-20: 1 via ORAL
  Filled 2016-01-20: qty 1

## 2016-01-20 NOTE — MAU Provider Note (Signed)
History     CSN: [id number]  Arrival date and time: 01/20/16 1443   First Provider Initiated Contact with Patient 01/20/16 1656      Chief Complaint  Patient presents with  . Headache   Teresa Massey is a 35 y.o. [id number] at [redacted]w[redacted]d who presents with headache. Denies abdominal pain, LOF, or VB. Positive fetal movement.    Headache   This is a new problem. The current episode started yesterday. The problem occurs constantly. The problem has been gradually worsening. The pain is located in the frontal and occipital region. The pain radiates to the left neck and right neck. The pain quality is similar to prior headaches. The quality of the pain is described as band-like and pulsating. The pain is at a severity of 8/10. Associated symptoms include nausea, phonophobia and photophobia. Pertinent negatives include no abdominal pain, blurred vision, coughing, dizziness, fever, hearing loss, tinnitus, visual change or vomiting. The symptoms are aggravated by bright light and noise. Treatments tried: fioricet x2. The treatment provided no relief. There is no history of hypertension, pseudotumor cerebri or recent head traumas.    OB History    Gravida Para Term Preterm AB Living   4 2 2   1 2    SAB TAB Ectopic Multiple Live Births     1   0 2      Past Medical History:  Diagnosis Date  . Anemia   . Genital herpes   . H/O eye surgery    bil "lazy" eyes  . Hx of bilateral breast reduction surgery 2009  . Hx of cholecystectomy 2005  . Kidney stones 2008, 2011    Past Surgical History:  Procedure Laterality Date  . BREAST REDUCTION SURGERY  2009  . CHOLECYSTECTOMY  2005  . EYE SURGERY     for a "lazy" eye (both eyes)    Family History  Problem Relation Age of Onset  . Asthma Father   . Hypertension Father   . Cancer Paternal Grandmother     pancreatic cancer    Social History  Substance Use Topics  . Smoking status: Never Smoker  . Smokeless tobacco: Never Used  . Alcohol  use No    Allergies:  Allergies  Allergen Reactions  . Morphine Rash and Other (See Comments)    Reaction:  Burning      Prescriptions Prior to Admission  Medication Sig Dispense Refill Last Dose  . butalbital-acetaminophen-caffeine (FIORICET, ESGIC) 50-325-40 MG tablet Take 1-2 tablets by mouth every 6 (six) hours as needed for headache. 20 tablet 0 01/20/2016 at Unknown time  . Prenatal Vit-Fe Fumarate-FA (PRENATAL MULTIVITAMIN) TABS tablet Take 1 tablet by mouth daily.    01/20/2016 at Unknown time  . valACYclovir (VALTREX) 1000 MG tablet Take 0.5 tablets (500 mg total) by mouth 2 (two) times daily. 60 tablet 2 01/20/2016 at Unknown time  . glucose blood (ACCU-CHEK AVIVA PLUS) test strip Use as instructed 100 each 12 Taking  . Lancet Devices (ACCU-CHEK SOFTCLIX) lancets Use as instructed 1 each 0 Taking  . NIFEdipine (PROCARDIA XL) 30 MG 24 hr tablet Take 1 tablet (30 mg total) by mouth daily. (Patient not taking: Reported on 01/20/2016) 60 tablet 2 Not Taking at Unknown time    Review of Systems  Constitutional: Negative for chills and fever.  HENT: Negative for hearing loss and tinnitus.   Eyes: Positive for photophobia. Negative for blurred vision.  Respiratory: Negative for cough.   Gastrointestinal: Positive for nausea. Negative for  abdominal pain and vomiting.  Genitourinary: Negative.   Neurological: Positive for headaches. Negative for dizziness, sensory change and speech change.   Physical Exam   Blood pressure 114/67, pulse 119, temperature 98.2 F (36.8 C), temperature source Oral, resp. rate 16, weight 200 lb 12 oz (91.1 kg), last menstrual period 05/14/2015, SpO2 100 %, unknown if currently breastfeeding.  Physical Exam  Nursing note and vitals reviewed. Constitutional: She is oriented to person, place, and time. She appears well-developed and well-nourished. She appears distressed.  HENT:  Head: Normocephalic and atraumatic.  Eyes: Conjunctivae are normal. Pupils  are equal, round, and reactive to light. Right eye exhibits no discharge. Left eye exhibits no discharge. No scleral icterus.  Neck: Normal range of motion.  Cardiovascular: Normal rate, regular rhythm and normal heart sounds.   No murmur heard. Respiratory: Effort normal and breath sounds normal. No respiratory distress. She has no wheezes.  GI: Soft. There is no tenderness.  Neurological: She is alert and oriented to person, place, and time. She has normal strength.  Skin: Skin is warm and dry. She is not diaphoretic.  Psychiatric: She has a normal mood and affect. Her behavior is normal. Judgment and thought content normal.   Fetal Tracing:  Baseline: 140 Variability: moderate Accelerations: 15x15 Decelerations: none  Toco: irregular MAU Course  Procedures Results for orders placed or performed during the hospital encounter of 01/20/16 (from the past 24 hour(s))  Urinalysis, Routine w reflex microscopic (not at [facility])     Status: Abnormal   Collection Time: 01/20/16  3:14 PM  Result Value Ref Range   Color, Urine YELLOW YELLOW   APPearance CLEAR CLEAR   Specific Gravity, Urine 1.010 1.005 - 1.030   pH 6.5 5.0 - 8.0   Glucose, UA NEGATIVE NEGATIVE mg/dL   Hgb urine dipstick NEGATIVE NEGATIVE   Bilirubin Urine NEGATIVE NEGATIVE   Ketones, ur 15 (A) NEGATIVE mg/dL   Protein, ur NEGATIVE NEGATIVE mg/dL   Nitrite NEGATIVE NEGATIVE   Leukocytes, UA NEGATIVE NEGATIVE  CBC     Status: Abnormal   Collection Time: 01/20/16  5:19 PM  Result Value Ref Range   WBC 10.1 4.0 - 10.5 K/uL   RBC 3.80 (L) 3.87 - 5.11 MIL/uL   Hemoglobin 11.9 (L) 12.0 - 15.0 g/dL   HCT 16.134.6 (L) 09.636.0 - 04.546.0 %   MCV 91.1 78.0 - 100.0 fL   MCH 31.3 26.0 - 34.0 pg   MCHC 34.4 30.0 - 36.0 g/dL   RDW 40.914.5 81.111.5 - 91.415.5 %   Platelets 238 150 - 400 K/uL  Basic metabolic panel     Status: Abnormal   Collection Time: 01/20/16  5:19 PM  Result Value Ref Range   Sodium 136 135 - 145 mmol/L   Potassium 3.8 3.5 -  5.1 mmol/L   Chloride 106 101 - 111 mmol/L   CO2 21 (L) 22 - 32 mmol/L   Glucose, Bld 96 65 - 99 mg/dL   BUN 6 6 - 20 mg/dL   Creatinine, Ser 7.820.45 0.44 - 1.00 mg/dL   Calcium 8.8 (L) 8.9 - 10.3 mg/dL   GFR calc non Af Amer >60 >60 mL/min   GFR calc Af Amer >60 >60 mL/min   Anion gap 9 5 - 15    MDM Category 1 tracing IV LR bolus & headache cocktail (reglan, benadryl, decadron) Pt reports no improvement in symptoms Per review of records; pt seen in MAU during previous pregnancy for same complaint; headache cocktail failed;  pt was given dilaudid & fioricet with relief Dilaudid 0.5 mg, Fioricet 1 tab PO  Care [facility] Schyler Butikofer CNM Judeth HornErin Lawrence, NP 01/20/2016 8:06 PM   Patient has had headache cocktail and dilaudid/fioriceit. She reports that her headache has improved some. Sleeping in the room at this time.  Assessment and Plan   Headache in pregnancy in third trimester   DC home Comfort measures reviewed  3rd Trimester precautions  PTL precautions  Fetal kick counts RX: none  Return to MAU as needed FU with OB as planned  Follow-up Information    [facility] Follow up.   Contact information: [address] [place] [id number] [phone]

## 2016-01-20 NOTE — MAU Note (Addendum)
Patient presents with c/o headache that started last night. Patient states she took 2 fioricet at 0845 with no relief. Patient denies any bleeding or LOF. Fetus active.

## 2016-01-20 NOTE — Discharge Instructions (Signed)

## 2016-01-21 ENCOUNTER — Encounter (HOSPITAL_COMMUNITY): Payer: Self-pay

## 2016-01-21 ENCOUNTER — Inpatient Hospital Stay (HOSPITAL_COMMUNITY)
Admission: AD | Admit: 2016-01-21 | Discharge: 2016-01-22 | Disposition: A | Discharge status: Home / Self Care | Payer: Medicaid Other | Source: Ambulatory Visit | Attending: Obstetrics and Gynecology | Admitting: Obstetrics and Gynecology

## 2016-01-21 DIAGNOSIS — O26893 Other specified pregnancy related conditions, third trimester: Secondary | ICD-10-CM | POA: Diagnosis not present

## 2016-01-21 DIAGNOSIS — R109 Unspecified abdominal pain: Secondary | ICD-10-CM | POA: Insufficient documentation

## 2016-01-21 DIAGNOSIS — Z3A35 35 weeks gestation of pregnancy: Secondary | ICD-10-CM | POA: Insufficient documentation

## 2016-01-21 DIAGNOSIS — O479 False labor, unspecified: Secondary | ICD-10-CM

## 2016-01-21 DIAGNOSIS — Z8249 Family history of ischemic heart disease and other diseases of the circulatory system: Secondary | ICD-10-CM | POA: Diagnosis not present

## 2016-01-21 DIAGNOSIS — Z885 Allergy status to narcotic agent status: Secondary | ICD-10-CM | POA: Insufficient documentation

## 2016-01-21 DIAGNOSIS — O4700 False labor before 37 completed weeks of gestation, unspecified trimester: Secondary | ICD-10-CM

## 2016-01-21 LAB — URINALYSIS, ROUTINE W REFLEX MICROSCOPIC
BILIRUBIN URINE: NEGATIVE
Glucose, UA: NEGATIVE mg/dL
Hgb urine dipstick: NEGATIVE
Ketones, ur: NEGATIVE mg/dL
Leukocytes, UA: NEGATIVE
NITRITE: NEGATIVE
PROTEIN: NEGATIVE mg/dL
Specific Gravity, Urine: 1.01 (ref 1.005–1.030)
pH: 7.5 (ref 5.0–8.0)

## 2016-01-21 LAB — CULTURE, BETA STREP (GROUP B ONLY)

## 2016-01-21 MED ORDER — NIFEDIPINE 10 MG PO CAPS
10.0000 mg | ORAL_CAPSULE | ORAL | Status: AC
  Administered 2016-01-21 – 2016-01-22 (×3): 10 mg via ORAL
  Filled 2016-01-21 (×3): qty 1

## 2016-01-21 NOTE — MAU Note (Signed)
Pt presents to MAU with ctx. Reports they started around 1800 and have gotten stronger. States ctx are "close", unsure of how many minutes. Denies bleeding or leaking of fluid. Reports good fetal mvmt.

## 2016-01-21 NOTE — Discharge Instructions (Signed)

## 2016-01-21 NOTE — MAU Provider Note (Signed)
History     CSN: [id number]  Arrival date and time: 01/21/16 2226   First Provider Initiated Contact with Patient 01/21/16 2257      Chief Complaint  Patient presents with  . Contractions   Patient is a 35yo [id number] @ 35.6wks who was seen and evaluated at the bedside. Patient presents to the MAU for contractions that started and progressively worsened at 1800. Patient denies any Ha, SOB, N/V, trauma, change in vaginal discharge, and states that she feels the baby move      Past Medical History:  Diagnosis Date  . Anemia   . Genital herpes   . H/O eye surgery    bil "lazy" eyes  . Hx of bilateral breast reduction surgery 2009  . Hx of cholecystectomy 2005  . Kidney stones 2008, 2011    Past Surgical History:  Procedure Laterality Date  . BREAST REDUCTION SURGERY  2009  . CHOLECYSTECTOMY  2005  . EYE SURGERY     for a "lazy" eye (both eyes)    Family History  Problem Relation Age of Onset  . Asthma Father   . Hypertension Father   . Cancer Paternal Grandmother     pancreatic cancer    Social History  Substance Use Topics  . Smoking status: Never Smoker  . Smokeless tobacco: Never Used  . Alcohol use No    Allergies:  Allergies  Allergen Reactions  . Morphine Rash and Other (See Comments)    Reaction:  Burning      Prescriptions Prior to Admission  Medication Sig Dispense Refill Last Dose  . butalbital-acetaminophen-caffeine (FIORICET, ESGIC) 50-325-40 MG tablet Take 1-2 tablets by mouth every 6 (six) hours as needed for headache. 20 tablet 0 01/20/2016 at Unknown time  . glucose blood (ACCU-CHEK AVIVA PLUS) test strip Use as instructed 100 each 12 Taking  . Lancet Devices (ACCU-CHEK SOFTCLIX) lancets Use as instructed 1 each 0 Taking  . Prenatal Vit-Fe Fumarate-FA (PRENATAL MULTIVITAMIN) TABS tablet Take 1 tablet by mouth daily.    01/20/2016 at Unknown time  . valACYclovir (VALTREX) 1000 MG tablet Take 0.5 tablets (500 mg total) by mouth 2 (two) times  daily. 60 tablet 2 01/20/2016 at Unknown time    Review of Systems  Constitutional: Negative for chills and fever.  Eyes: Negative for blurred vision.  Respiratory: Negative for shortness of breath.   Cardiovascular: Negative for chest pain.  Gastrointestinal: Positive for abdominal pain. Negative for constipation, diarrhea, nausea and vomiting.  Genitourinary: Negative for dysuria.  Neurological: Negative for dizziness.  Psychiatric/Behavioral: Negative for depression.   Physical Exam   Blood pressure 122/70, pulse 105, temperature 97.7 F (36.5 C), temperature source Oral, resp. rate 18, last menstrual period 05/14/2015, unknown if currently breastfeeding.  Physical Exam  Constitutional: She is oriented to person, place, and time. She appears well-developed and well-nourished.  HENT:  Head: Normocephalic and atraumatic.  Eyes: Conjunctivae and EOM are normal.  Neck: Normal range of motion.  Cardiovascular: Normal rate, regular rhythm and intact distal pulses.   Respiratory: Effort normal. No respiratory distress.  GI: Soft. There is no tenderness.  EFM 135-145, +accels, no decels Ctx irreg q 2-6 min  Genitourinary: Vagina normal.  Musculoskeletal: Normal range of motion.  Neurological: She is alert and oriented to person, place, and time.  Skin: Skin is warm and dry.  Psychiatric: She has a normal mood and affect. Her behavior is normal. Judgment and thought content normal.   Dilation: Fingertip Effacement (%): Thick Cervical  Position: Middle Exam by:: Dione PloverA. Jones RN  MAU Course  Procedures  MDM Patient will be given 10mg  of procardia Q20 min x 3 - Ua: WNL Patient cervical exam remains unchanged S/P procardia x3 Patient will be discharged home with labor precautions   Assessment and Plan  35 yo G4P2 @ 3135 6/7 who presents to the MAU for a preterm labor check - Patient to be discharged home with labor precautions   Josue D Santos 01/21/2016, 11:15 PM   I have  participated in the care of this patient and I agree with the above. Cam HaiSHAW, KIMBERLY CNM 4:38 AM 01/22/2016

## 2016-01-27 ENCOUNTER — Encounter (HOSPITAL_COMMUNITY): Payer: Self-pay | Admitting: *Deleted

## 2016-01-27 ENCOUNTER — Inpatient Hospital Stay (HOSPITAL_COMMUNITY)
Admission: AD | Admit: 2016-01-27 | Discharge: 2016-01-27 | Disposition: A | Discharge status: Home / Self Care | Payer: Medicaid Other | Source: Ambulatory Visit | Attending: Family Medicine | Admitting: Family Medicine

## 2016-01-27 ENCOUNTER — Ambulatory Visit (INDEPENDENT_AMBULATORY_CARE_PROVIDER_SITE_OTHER): Payer: Medicaid Other | Admitting: Obstetrics & Gynecology

## 2016-01-27 VITALS — BP 105/66 | HR 105 | Wt 201.1 lb

## 2016-01-27 DIAGNOSIS — Z3A00 Weeks of gestation of pregnancy not specified: Secondary | ICD-10-CM | POA: Diagnosis not present

## 2016-01-27 DIAGNOSIS — A6009 Herpesviral infection of other urogenital tract: Secondary | ICD-10-CM

## 2016-01-27 DIAGNOSIS — O0990 Supervision of high risk pregnancy, unspecified, unspecified trimester: Secondary | ICD-10-CM

## 2016-01-27 DIAGNOSIS — O24419 Gestational diabetes mellitus in pregnancy, unspecified control: Secondary | ICD-10-CM

## 2016-01-27 DIAGNOSIS — O98319 Other infections with a predominantly sexual mode of transmission complicating pregnancy, unspecified trimester: Secondary | ICD-10-CM

## 2016-01-27 DIAGNOSIS — O4700 False labor before 37 completed weeks of gestation, unspecified trimester: Secondary | ICD-10-CM | POA: Insufficient documentation

## 2016-01-27 LAB — POCT URINALYSIS DIP (DEVICE)
BILIRUBIN URINE: NEGATIVE
GLUCOSE, UA: NEGATIVE mg/dL
KETONES UR: NEGATIVE mg/dL
Nitrite: NEGATIVE
Protein, ur: NEGATIVE mg/dL
Specific Gravity, Urine: 1.02 (ref 1.005–1.030)
Urobilinogen, UA: 0.2 mg/dL (ref 0.0–1.0)
pH: 7 (ref 5.0–8.0)

## 2016-01-27 NOTE — Progress Notes (Signed)
irregular UC, was checked in MAU 6 hr ago    PRENATAL VISIT NOTE  Subjective:  Teresa Massey is a 35 y.o. [phone] at 3058w5d being seen today for ongoing prenatal care.  She is currently monitored for the following issues for this high-risk pregnancy and has Genital herpes; Genital herpes affecting pregnancy, antepartum; Supervision of high risk pregnancy, antepartum; Preterm contractions; and Gestational diabetes mellitus (GDM) affecting pregnancy on her problem list.  Patient reports contractions since Q3-5 min.  Contractions: Irregular. Vag. Bleeding: None.  Movement: Present. Denies leaking of fluid.   The following portions of the patient's history were reviewed and updated as appropriate: allergies, current medications, past family history, past medical history, past social history, past surgical history and problem list. Problem list updated.  Objective:   Vitals:   01/27/16 0755  BP: 105/66  Pulse: (!) 105  Weight: 91.2 kg (201 lb 1.6 oz)    Fetal Status: Fetal Heart Rate (bpm): 140 Fundal Height: 36 cm Movement: Present     General:  Alert, oriented and cooperative. Patient is in no acute distress.  Skin: Skin is warm and dry. No rash noted.   Cardiovascular: Normal heart rate noted  Respiratory: Normal respiratory effort, no problems with respiration noted  Abdomen: Soft, gravid, appropriate for gestational age. Pain/Pressure: Present     Pelvic:  Cervical exam performed        Extremities: Normal range of motion.  Edema: None  Mental Status: Normal mood and affect. Normal behavior. Normal judgment and thought content.   Assessment and Plan:  Pregnancy: [id number] at 4858w5d  1. Supervision of high risk pregnancy, antepartum BHC, no Cx change  2. Gestational diabetes mellitus (GDM) affecting pregnancy FBS reported 90, PP < 119  Preterm labor symptoms and general obstetric precautions including but not limited to vaginal bleeding, contractions, leaking of fluid and  fetal movement were reviewed in detail with the patient. Please refer to After Visit Summary for other counseling recommendations.  Return in about 1 week (around 02/03/2016).   Adam PhenixJames G [place], MD

## 2016-01-27 NOTE — Patient Instructions (Signed)

## 2016-01-27 NOTE — Discharge Instructions (Signed)
Braxton Hicks Contractions °Contractions of the uterus can occur throughout pregnancy. Contractions are not always a sign that you are in labor.  °WHAT ARE BRAXTON HICKS CONTRACTIONS?  °Contractions that occur before labor are called Braxton Hicks contractions, or false labor. Toward the end of pregnancy (32-34 weeks), these contractions can develop more often and may become more forceful. This is not true labor because these contractions do not result in opening (dilatation) and thinning of the cervix. They are sometimes difficult to tell apart from true labor because these contractions can be forceful and people have different pain tolerances. You should not feel embarrassed if you go to the hospital with false labor. Sometimes, the only way to tell if you are in true labor is for your health care provider to look for changes in the cervix. °If there are no prenatal problems or other health problems associated with the pregnancy, it is completely safe to be sent home with false labor and await the onset of true labor. °HOW CAN YOU TELL THE DIFFERENCE BETWEEN TRUE AND FALSE LABOR? °False Labor  °· The contractions of false labor are usually shorter and not as hard as those of true labor.   °· The contractions are usually irregular.   °· The contractions are often felt in the front of the lower abdomen and in the groin.   °· The contractions may go away when you walk around or change positions while lying down.   °· The contractions get weaker and are shorter lasting as time goes on.   °· The contractions do not usually become progressively stronger, regular, and closer together as with true labor.   °True Labor  °· Contractions in true labor last 30-70 seconds, become very regular, usually become more intense, and increase in frequency.   °· The contractions do not go away with walking.   °· The discomfort is usually felt in the top of the uterus and spreads to the lower abdomen and low back.   °· True labor can be  determined by your health care provider with an exam. This will show that the cervix is dilating and getting thinner.   °WHAT TO REMEMBER °· Keep up with your usual exercises and follow other instructions given by your health care provider.   °· Take medicines as directed by your health care provider.   °· Keep your regular prenatal appointments.   °· Eat and drink lightly if you think you are going into labor.   °· If Braxton Hicks contractions are making you uncomfortable:   °¨ Change your position from lying down or resting to walking, or from walking to resting.   °¨ Sit and rest in a tub of warm water.   °¨ Drink 2-3 glasses of water. Dehydration may cause these contractions.   °¨ Do slow and deep breathing several times an hour.   °WHEN SHOULD I SEEK IMMEDIATE MEDICAL CARE? °Seek immediate medical care if: °· Your contractions become stronger, more regular, and closer together.   °· You have fluid leaking or gushing from your vagina.   °· You have a fever.   °· You pass blood-tinged mucus.   °· You have vaginal bleeding.   °· You have continuous abdominal pain.   °· You have low back pain that you never had before.   °· You feel your baby's head pushing down and causing pelvic pressure.   °· Your baby is not moving as much as it used to.   °This information is not intended to replace advice given to you by your health care provider. Make sure you discuss any questions you have with your health care   provider. °Document Released: 03/01/2005 Document Revised: 06/23/2015 Document Reviewed: 12/11/2012 °Elsevier Interactive Patient Education © 2017 [employer] ° °

## 2016-01-27 NOTE — MAU Note (Signed)
Pt reports contractions, and ? Leaking fluid yesterday , none today.

## 2016-02-04 ENCOUNTER — Encounter: Payer: Self-pay | Admitting: Obstetrics & Gynecology

## 2016-02-04 ENCOUNTER — Ambulatory Visit (INDEPENDENT_AMBULATORY_CARE_PROVIDER_SITE_OTHER): Payer: Medicaid Other | Admitting: Obstetrics & Gynecology

## 2016-02-04 VITALS — BP 122/70 | HR 97 | Wt 201.4 lb

## 2016-02-04 DIAGNOSIS — O0990 Supervision of high risk pregnancy, unspecified, unspecified trimester: Secondary | ICD-10-CM

## 2016-02-04 DIAGNOSIS — O24419 Gestational diabetes mellitus in pregnancy, unspecified control: Secondary | ICD-10-CM

## 2016-02-04 NOTE — Progress Notes (Signed)
Pt reports "alot of pressure" lower abd, groin, lower back".  Pt requests cervical exam.   US unable to be scheduled with MFM due to appt not being until 02/13/16.  Provider needed US to be next before she is 39 wks. Patient can not make it to the appt on 02/13/16 @ 1500 due to having to pick up children.  Contacted [facility] to see if she could get the US completed at their office was informed that we booked and the next appt they would have is 02/19/16.  Per provider, pt given contact info to [facility] to call to find out if there is a cancellation to get an appt for next week.  Pt stated understanding with no further questions.

## 2016-02-11 ENCOUNTER — Ambulatory Visit (INDEPENDENT_AMBULATORY_CARE_PROVIDER_SITE_OTHER): Payer: Medicaid Other | Admitting: Family Medicine

## 2016-02-11 ENCOUNTER — Other Ambulatory Visit: Payer: Self-pay | Admitting: Advanced Practice Midwife

## 2016-02-11 VITALS — BP 110/68 | HR 72 | Wt 201.5 lb

## 2016-02-11 DIAGNOSIS — O24419 Gestational diabetes mellitus in pregnancy, unspecified control: Secondary | ICD-10-CM

## 2016-02-11 DIAGNOSIS — O98313 Other infections with a predominantly sexual mode of transmission complicating pregnancy, third trimester: Secondary | ICD-10-CM | POA: Diagnosis not present

## 2016-02-11 DIAGNOSIS — A6009 Herpesviral infection of other urogenital tract: Secondary | ICD-10-CM

## 2016-02-11 DIAGNOSIS — O0990 Supervision of high risk pregnancy, unspecified, unspecified trimester: Secondary | ICD-10-CM

## 2016-02-11 DIAGNOSIS — A609 Anogenital herpesviral infection, unspecified: Secondary | ICD-10-CM

## 2016-02-11 DIAGNOSIS — O98319 Other infections with a predominantly sexual mode of transmission complicating pregnancy, unspecified trimester: Secondary | ICD-10-CM

## 2016-02-11 NOTE — Patient Instructions (Signed)
Third Trimester of Pregnancy The third trimester is from week 29 through week 40 (months 7 through 9). The third trimester is a time when the unborn baby (fetus) is growing rapidly. At the end of the ninth month, the fetus is about 20 inches in length and weighs 6-10 pounds. Body changes during your third trimester Your body goes through many changes during pregnancy. The changes vary from woman to woman. During the third trimester:  Your weight will continue to increase. You can expect to gain 25-35 pounds (11-16 kg) by the end of the pregnancy.  You may begin to get stretch marks on your hips, abdomen, and breasts.  You may urinate more often because the fetus is moving lower into your pelvis and pressing on your bladder.  You may develop or continue to have heartburn. This is caused by increased hormones that slow down muscles in the digestive tract.  You may develop or continue to have constipation because increased hormones slow digestion and cause the muscles that push waste through your intestines to relax.  You may develop hemorrhoids. These are swollen veins (varicose veins) in the rectum that can itch or be painful.  You may develop swollen, bulging veins (varicose veins) in your legs.  You may have increased body aches in the pelvis, back, or thighs. This is due to weight gain and increased hormones that are relaxing your joints.  You may have changes in your hair. These can include thickening of your hair, rapid growth, and changes in texture. Some women also have hair loss during or after pregnancy, or hair that feels dry or thin. Your hair will most likely return to normal after your baby is born.  Your breasts will continue to grow and they will continue to become tender. A yellow fluid (colostrum) may leak from your breasts. This is the first milk you are producing for your baby.  Your belly button may stick out.  You may notice more swelling in your hands, face, or  ankles.  You may have increased tingling or numbness in your hands, arms, and legs. The skin on your belly may also feel numb.  You may feel short of breath because of your expanding uterus.  You may have more problems sleeping. This can be caused by the size of your belly, increased need to urinate, and an increase in your body's metabolism.  You may notice the fetus "dropping," or moving lower in your abdomen.  You may have increased vaginal discharge.  Your cervix becomes thin and soft (effaced) near your due date. What to expect at prenatal visits You will have prenatal exams every 2 weeks until week 36. Then you will have weekly prenatal exams. During a routine prenatal visit:  You will be weighed to make sure you and the fetus are growing normally.  Your blood pressure will be taken.  Your abdomen will be measured to track your baby's growth.  The fetal heartbeat will be listened to.  Any test results from the previous visit will be discussed.  You may have a cervical check near your due date to see if you have effaced. At around 36 weeks, your health care provider will check your cervix. At the same time, your health care provider will also perform a test on the secretions of the vaginal tissue. This test is to determine if a type of bacteria, Group B streptococcus, is present. Your health care provider will explain this further. Your health care provider may ask you:    What your birth plan is.  How you are feeling.  If you are feeling the baby move.  If you have had any abnormal symptoms, such as leaking fluid, bleeding, severe headaches, or abdominal cramping.  If you are using any tobacco products, including cigarettes, chewing tobacco, and electronic cigarettes.  If you have any questions. Other tests or screenings that may be performed during your third trimester include:  Blood tests that check for low iron levels (anemia).  Fetal testing to check the health,  activity level, and growth of the fetus. Testing is done if you have certain medical conditions or if there are problems during the pregnancy.  Nonstress test (NST). This test checks the health of your baby to make sure there are no signs of problems, such as the baby not getting enough oxygen. During this test, a belt is placed around your belly. The baby is made to move, and its heart rate is monitored during movement. What is false labor? False labor is a condition in which you feel small, irregular tightenings of the muscles in the womb (contractions) that eventually go away. These are called Braxton Hicks contractions. Contractions may last for hours, days, or even weeks before true labor sets in. If contractions come at regular intervals, become more frequent, increase in intensity, or become painful, you should see your health care provider. What are the signs of labor?  Abdominal cramps.  Regular contractions that start at 10 minutes apart and become stronger and more frequent with time.  Contractions that start on the top of the uterus and spread down to the lower abdomen and back.  Increased pelvic pressure and dull back pain.  A watery or bloody mucus discharge that comes from the vagina.  Leaking of amniotic fluid. This is also known as your "water breaking." It could be a slow trickle or a gush. Let your doctor know if it has a color or strange odor. If you have any of these signs, call your health care provider right away, even if it is before your due date. Follow these instructions at home: Eating and drinking  Continue to eat regular, healthy meals.  Do not eat:  Raw meat or meat spreads.  Unpasteurized milk or cheese.  Unpasteurized juice.  Store-made salad.  Refrigerated smoked seafood.  Hot dogs or deli meat, unless they are piping hot.  More than 6 ounces of albacore tuna a week.  Shark, swordfish, king mackerel, or tile fish.  Store-made salads.  Raw  sprouts, such as mung bean or alfalfa sprouts.  Take prenatal vitamins as told by your health care provider.  Take 1000 mg of calcium daily as told by your health care provider.  If you develop constipation:  Take over-the-counter or prescription medicines.  Drink enough fluid to keep your urine clear or pale yellow.  Eat foods that are high in fiber, such as fresh fruits and vegetables, whole grains, and beans.  Limit foods that are high in fat and processed sugars, such as fried and sweet foods. Activity  Exercise only as directed by your health care provider. Healthy pregnant women should aim for 2 hours and 30 minutes of moderate exercise per week. If you experience any pain or discomfort while exercising, stop.  Avoid heavy lifting.  Do not exercise in extreme heat or humidity, or at high altitudes.  Wear low-heel, comfortable shoes.  Practice good posture.  Do not travel far distances unless it is absolutely necessary and only with the approval   of your health care provider.  Wear your seat belt at all times while in a car, on a bus, or on a plane.  Take frequent breaks and rest with your legs elevated if you have leg cramps or low back pain.  Do not use hot tubs, steam rooms, or saunas.  You may continue to have sex unless your health care provider tells you otherwise. Lifestyle  Do not use any products that contain nicotine or tobacco, such as cigarettes and e-cigarettes. If you need help quitting, ask your health care provider.  Do not drink alcohol.  Do not use any medicinal herbs or unprescribed drugs. These chemicals affect the formation and growth of the baby.  If you develop varicose veins:  Wear support pantyhose or compression stockings as told by your healthcare provider.  Elevate your feet for 15 minutes, 3-4 times a day.  Wear a supportive maternity bra to help with breast tenderness. General instructions  Take over-the-counter and prescription  medicines only as told by your health care provider. There are medicines that are either safe or unsafe to take during pregnancy.  Take warm sitz baths to soothe any pain or discomfort caused by hemorrhoids. Use hemorrhoid cream or witch hazel if your health care provider approves.  Avoid cat litter boxes and soil used by cats. These carry germs that can cause birth defects in the baby. If you have a cat, ask someone to clean the litter box for you.  To prepare for the arrival of your baby:  Take prenatal classes to understand, practice, and ask questions about the labor and delivery.  Make a trial run to the hospital.  Visit the hospital and tour the maternity area.  Arrange for maternity or paternity leave through employers.  Arrange for family and friends to take care of pets while you are in the hospital.  Purchase a rear-facing car seat and make sure you know how to install it in your car.  Pack your hospital bag.  Prepare the baby's nursery. Make sure to remove all pillows and stuffed animals from the baby's crib to prevent suffocation.  Visit your dentist if you have not gone during your pregnancy. Use a soft toothbrush to brush your teeth and be gentle when you floss.  Keep all prenatal follow-up visits as told by your health care provider. This is important. Contact a health care provider if:  You are unsure if you are in labor or if your water has broken.  You become dizzy.  You have mild pelvic cramps, pelvic pressure, or nagging pain in your abdominal area.  You have lower back pain.  You have persistent nausea, vomiting, or diarrhea.  You have an unusual or bad smelling vaginal discharge.  You have pain when you urinate. Get help right away if:  You have a fever.  You are leaking fluid from your vagina.  You have spotting or bleeding from your vagina.  You have severe abdominal pain or cramping.  You have rapid weight loss or weight gain.  You have  shortness of breath with chest pain.  You notice sudden or extreme swelling of your face, hands, ankles, feet, or legs.  Your baby makes fewer than 10 movements in 2 hours.  You have severe headaches that do not go away with medicine.  You have vision changes. Summary  The third trimester is from week 29 through week 40, months 7 through 9. The third trimester is a time when the unborn baby (fetus)   is growing rapidly.  During the third trimester, your discomfort may increase as you and your baby continue to gain weight. You may have abdominal, leg, and back pain, sleeping problems, and an increased need to urinate.  During the third trimester your breasts will keep growing and they will continue to become tender. A yellow fluid (colostrum) may leak from your breasts. This is the first milk you are producing for your baby.  False labor is a condition in which you feel small, irregular tightenings of the muscles in the womb (contractions) that eventually go away. These are called Braxton Hicks contractions. Contractions may last for hours, days, or even weeks before true labor sets in.  Signs of labor can include: abdominal cramps; regular contractions that start at 10 minutes apart and become stronger and more frequent with time; watery or bloody mucus discharge that comes from the vagina; increased pelvic pressure and dull back pain; and leaking of amniotic fluid. This information is not intended to replace advice given to you by your health care provider. Make sure you discuss any questions you have with your health care provider. Document Released: 02/23/2001 Document Revised: 08/07/2015 Document Reviewed: 05/02/2012 [employer] Interactive Patient Education  2017 [employer]   Breastfeeding Deciding to breastfeed is one of the best choices you can make for you and your baby. A change in hormones during pregnancy causes your breast tissue to grow and increases the number and size of your  milk ducts. These hormones also allow proteins, sugars, and fats from your blood supply to make breast milk in your milk-producing glands. Hormones prevent breast milk from being released before your baby is born as well as prompt milk flow after birth. Once breastfeeding has begun, thoughts of your baby, as well as his or her sucking or crying, can stimulate the release of milk from your milk-producing glands. Benefits of breastfeeding For Your Baby  Your first milk (colostrum) helps your baby's digestive system function better.  There are antibodies in your milk that help your baby fight off infections.  Your baby has a lower incidence of asthma, allergies, and sudden infant death syndrome.  The nutrients in breast milk are better for your baby than infant formulas and are designed uniquely for your baby's needs.  Breast milk improves your baby's brain development.  Your baby is less likely to develop other conditions, such as childhood obesity, asthma, or type 2 diabetes mellitus. For You  Breastfeeding helps to create a very special bond between you and your baby.  Breastfeeding is convenient. Breast milk is always available at the correct temperature and costs nothing.  Breastfeeding helps to burn calories and helps you lose the weight gained during pregnancy.  Breastfeeding makes your uterus contract to its prepregnancy size faster and slows bleeding (lochia) after you give birth.  Breastfeeding helps to lower your risk of developing type 2 diabetes mellitus, osteoporosis, and breast or ovarian cancer later in life. Signs that your baby is hungry Early Signs of Hunger  Increased alertness or activity.  Stretching.  Movement of the head from side to side.  Movement of the head and opening of the mouth when the corner of the mouth or cheek is stroked (rooting).  Increased sucking sounds, smacking lips, cooing, sighing, or squeaking.  Hand-to-mouth movements.  Increased  sucking of fingers or hands. Late Signs of Hunger  Fussing.  Intermittent crying. Extreme Signs of Hunger  Signs of extreme hunger will require calming and consoling before your baby will   be able to breastfeed successfully. Do not wait for the following signs of extreme hunger to occur before you initiate breastfeeding:  Restlessness.  A loud, strong cry.  Screaming. Breastfeeding basics  Breastfeeding Initiation  Find a comfortable place to sit or lie down, with your neck and back well supported.  Place a pillow or rolled up blanket under your baby to bring him or her to the level of your breast (if you are seated). Nursing pillows are specially designed to help support your arms and your baby while you breastfeed.  Make sure that your baby's abdomen is facing your abdomen.  Gently massage your breast. With your fingertips, massage from your chest wall toward your nipple in a circular motion. This encourages milk flow. You may need to continue this action during the feeding if your milk flows slowly.  Support your breast with 4 fingers underneath and your thumb above your nipple. Make sure your fingers are well away from your nipple and your baby's mouth.  Stroke your baby's lips gently with your finger or nipple.  When your baby's mouth is open wide enough, quickly bring your baby to your breast, placing your entire nipple and as much of the colored area around your nipple (areola) as possible into your baby's mouth.  More areola should be visible above your baby's upper lip than below the lower lip.  Your baby's tongue should be between his or her lower gum and your breast.  Ensure that your baby's mouth is correctly positioned around your nipple (latched). Your baby's lips should create a seal on your breast and be turned out (everted).  It is common for your baby to suck about 2-3 minutes in order to start the flow of breast milk. Latching  Teaching your baby how to latch  on to your breast properly is very important. An improper latch can cause nipple pain and decreased milk supply for you and poor weight gain in your baby. Also, if your baby is not latched onto your nipple properly, he or she may swallow some air during feeding. This can make your baby fussy. Burping your baby when you switch breasts during the feeding can help to get rid of the air. However, teaching your baby to latch on properly is still the best way to prevent fussiness from swallowing air while breastfeeding. Signs that your baby has successfully latched on to your nipple:  Silent tugging or silent sucking, without causing you pain.  Swallowing heard between every 3-4 sucks.  Muscle movement above and in front of his or her ears while sucking. Signs that your baby has not successfully latched on to nipple:  Sucking sounds or smacking sounds from your baby while breastfeeding.  Nipple pain. If you think your baby has not latched on correctly, slip your finger into the corner of your baby's mouth to break the suction and place it between your baby's gums. Attempt breastfeeding initiation again. Signs of Successful Breastfeeding  Signs from your baby:  A gradual decrease in the number of sucks or complete cessation of sucking.  Falling asleep.  Relaxation of his or her body.  Retention of a small amount of milk in his or her mouth.  Letting go of your breast by himself or herself. Signs from you:  Breasts that have increased in firmness, weight, and size 1-3 hours after feeding.  Breasts that are softer immediately after breastfeeding.  Increased milk volume, as well as a change in milk consistency and color by   the fifth day of breastfeeding.  Nipples that are not sore, cracked, or bleeding. Signs That Your Baby is Getting Enough Milk  Wetting at least 1-2 diapers during the first 24 hours after birth.  Wetting at least 5-6 diapers every 24 hours for the first week after  birth. The urine should be clear or pale yellow by 5 days after birth.  Wetting 6-8 diapers every 24 hours as your baby continues to grow and develop.  At least 3 stools in a 24-hour period by age 5 days. The stool should be soft and yellow.  At least 3 stools in a 24-hour period by age 7 days. The stool should be seedy and yellow.  No loss of weight greater than 10% of birth weight during the first 3 days of age.  Average weight gain of 4-7 ounces (113-198 g) per week after age 4 days.  Consistent daily weight gain by age 5 days, without weight loss after the age of 2 weeks. After a feeding, your baby may spit up a small amount. This is common. Breastfeeding frequency and duration Frequent feeding will help you make more milk and can prevent sore nipples and breast engorgement. Breastfeed when you feel the need to reduce the fullness of your breasts or when your baby shows signs of hunger. This is called "breastfeeding on demand." Avoid introducing a pacifier to your baby while you are working to establish breastfeeding (the first 4-6 weeks after your baby is born). After this time you may choose to use a pacifier. Research has shown that pacifier use during the first year of a baby's life decreases the risk of sudden infant death syndrome (SIDS). Allow your baby to feed on each breast as long as he or she wants. Breastfeed until your baby is finished feeding. When your baby unlatches or falls asleep while feeding from the first breast, offer the second breast. Because newborns are often sleepy in the first few weeks of life, you may need to awaken your baby to get him or her to feed. Breastfeeding times will vary from baby to baby. However, the following rules can serve as a guide to help you ensure that your baby is properly fed:  Newborns (babies 4 weeks of age or younger) may breastfeed every 1-3 hours.  Newborns should not go longer than 3 hours during the day or 5 hours during the night  without breastfeeding.  You should breastfeed your baby a minimum of 8 times in a 24-hour period until you begin to introduce solid foods to your baby at around 6 months of age. Breast milk pumping Pumping and storing breast milk allows you to ensure that your baby is exclusively fed your breast milk, even at times when you are unable to breastfeed. This is especially important if you are going back to work while you are still breastfeeding or when you are not able to be present during feedings. Your lactation consultant can give you guidelines on how long it is safe to store breast milk. A breast pump is a machine that allows you to pump milk from your breast into a sterile bottle. The pumped breast milk can then be stored in a refrigerator or freezer. Some breast pumps are operated by hand, while others use electricity. Ask your lactation consultant which type will work best for you. Breast pumps can be purchased, but some hospitals and breastfeeding support groups lease breast pumps on a monthly basis. A lactation consultant can teach you how to   hand express breast milk, if you prefer not to use a pump. Caring for your breasts while you breastfeed Nipples can become dry, cracked, and sore while breastfeeding. The following recommendations can help keep your breasts moisturized and healthy:  Avoid using soap on your nipples.  Wear a supportive bra. Although not required, special nursing bras and tank tops are designed to allow access to your breasts for breastfeeding without taking off your entire bra or top. Avoid wearing underwire-style bras or extremely tight bras.  Air dry your nipples for 3-4minutes after each feeding.  Use only cotton bra pads to absorb leaked breast milk. Leaking of breast milk between feedings is normal.  Use lanolin on your nipples after breastfeeding. Lanolin helps to maintain your skin's normal moisture barrier. If you use pure lanolin, you do not need to wash it off  before feeding your baby again. Pure lanolin is not toxic to your baby. You may also hand express a few drops of breast milk and gently massage that milk into your nipples and allow the milk to air dry. In the first few weeks after giving birth, some women experience extremely full breasts (engorgement). Engorgement can make your breasts feel heavy, warm, and tender to the touch. Engorgement peaks within 3-5 days after you give birth. The following recommendations can help ease engorgement:  Completely empty your breasts while breastfeeding or pumping. You may want to start by applying warm, moist heat (in the shower or with warm water-soaked hand towels) just before feeding or pumping. This increases circulation and helps the milk flow. If your baby does not completely empty your breasts while breastfeeding, pump any extra milk after he or she is finished.  Wear a snug bra (nursing or regular) or tank top for 1-2 days to signal your body to slightly decrease milk production.  Apply ice packs to your breasts, unless this is too uncomfortable for you.  Make sure that your baby is latched on and positioned properly while breastfeeding. If engorgement persists after 48 hours of following these recommendations, contact your health care provider or a lactation consultant. Overall health care recommendations while breastfeeding  Eat healthy foods. Alternate between meals and snacks, eating 3 of each per day. Because what you eat affects your breast milk, some of the foods may make your baby more irritable than usual. Avoid eating these foods if you are sure that they are negatively affecting your baby.  Drink milk, fruit juice, and water to satisfy your thirst (about 10 glasses a day).  Rest often, relax, and continue to take your prenatal vitamins to prevent fatigue, stress, and anemia.  Continue breast self-awareness checks.  Avoid chewing and smoking tobacco. Chemicals from cigarettes that pass  into breast milk and exposure to secondhand smoke may harm your baby.  Avoid alcohol and drug use, including marijuana. Some medicines that may be harmful to your baby can pass through breast milk. It is important to ask your health care provider before taking any medicine, including all over-the-counter and prescription medicine as well as vitamin and herbal supplements. It is possible to become pregnant while breastfeeding. If birth control is desired, ask your health care provider about options that will be safe for your baby. Contact a health care provider if:  You feel like you want to stop breastfeeding or have become frustrated with breastfeeding.  You have painful breasts or nipples.  Your nipples are cracked or bleeding.  Your breasts are red, tender, or warm.  You have   a swollen area on either breast.  You have a fever or chills.  You have nausea or vomiting.  You have drainage other than breast milk from your nipples.  Your breasts do not become full before feedings by the fifth day after you give birth.  You feel sad and depressed.  Your baby is too sleepy to eat well.  Your baby is having trouble sleeping.  Your baby is wetting less than 3 diapers in a 24-hour period.  Your baby has less than 3 stools in a 24-hour period.  Your baby's skin or the white part of his or her eyes becomes yellow.  Your baby is not gaining weight by 5 days of age. Get help right away if:  Your baby is overly tired (lethargic) and does not want to wake up and feed.  Your baby develops an unexplained fever. This information is not intended to replace advice given to you by your health care provider. Make sure you discuss any questions you have with your health care provider. Document Released: 03/01/2005 Document Revised: 08/13/2015 Document Reviewed: 08/23/2012 Elsevier Interactive Patient Education  2017 Elsevier Inc.  

## 2016-02-11 NOTE — Progress Notes (Signed)
    PRENATAL VISIT NOTE  Subjective:  Teresa Massey is a 35 y.o. [phone] at 6482w6d being seen today for ongoing prenatal care.  She is currently monitored for the following issues for this high-risk pregnancy and has Genital herpes; Genital herpes affecting pregnancy, antepartum; Supervision of high risk pregnancy, antepartum; Preterm contractions; and Gestational diabetes mellitus (GDM) affecting pregnancy on her problem list.  Patient reports no complaints.  Contractions: Irregular.  .  Movement: Present. Denies leaking of fluid.   The following portions of the patient's history were reviewed and updated as appropriate: allergies, current medications, past family history, past medical history, past social history, past surgical history and problem list. Problem list updated.  Objective:   Vitals:   02/11/16 1253  BP: 110/68  Pulse: 72  Weight: 201 lb 8 oz (91.4 kg)    Fetal Status: Fetal Heart Rate (bpm): 156 Fundal Height: 38 cm Movement: Present  Presentation: Vertex  General:  Alert, oriented and cooperative. Patient is in no acute distress.  Skin: Skin is warm and dry. No rash noted.   Cardiovascular: Normal heart rate noted  Respiratory: Normal respiratory effort, no problems with respiration noted  Abdomen: Soft, gravid, appropriate for gestational age. Pain/Pressure: Present     Pelvic:  Cervical exam deferred        Extremities: Normal range of motion.  Edema: None  Mental Status: Normal mood and affect. Normal behavior. Normal judgment and thought content.   FBS 108-134 2 hr 96-155 Assessment and Plan:  Pregnancy: [id number] at 5182w6d  1. Gestational diabetes mellitus (GDM) affecting pregnancy Since BS are so high--it is time to start meds, but the patient will be 39 wks tomorrow--will just book for IOL.  2. Supervision of high risk pregnancy, antepartum   3. Genital herpes affecting pregnancy, antepartum Continue Valtrex.  Term labor symptoms and general  obstetric precautions including but not limited to vaginal bleeding, contractions, leaking of fluid and fetal movement were reviewed in detail with the patient. Please refer to After Visit Summary for other counseling recommendations.  Return in 3 weeks (on 03/03/2016) for pp check.   Reva Boresanya S Wendle Kina, MD

## 2016-02-12 ENCOUNTER — Inpatient Hospital Stay (HOSPITAL_COMMUNITY): Payer: Medicaid Other | Admitting: Anesthesiology

## 2016-02-12 ENCOUNTER — Encounter (HOSPITAL_COMMUNITY): Payer: Self-pay

## 2016-02-12 ENCOUNTER — Inpatient Hospital Stay (HOSPITAL_COMMUNITY)
Admission: RE | Admit: 2016-02-12 | Discharge: 2016-02-14 | DRG: 774 | Disposition: A | Discharge status: Home / Self Care | Payer: Medicaid Other | Source: Ambulatory Visit | Attending: Obstetrics and Gynecology | Admitting: Obstetrics and Gynecology

## 2016-02-12 DIAGNOSIS — O24429 Gestational diabetes mellitus in childbirth, unspecified control: Secondary | ICD-10-CM

## 2016-02-12 DIAGNOSIS — O24410 Gestational diabetes mellitus in pregnancy, diet controlled: Secondary | ICD-10-CM | POA: Diagnosis present

## 2016-02-12 DIAGNOSIS — Z8249 Family history of ischemic heart disease and other diseases of the circulatory system: Secondary | ICD-10-CM | POA: Diagnosis not present

## 2016-02-12 DIAGNOSIS — Z3A39 39 weeks gestation of pregnancy: Secondary | ICD-10-CM

## 2016-02-12 DIAGNOSIS — O9832 Other infections with a predominantly sexual mode of transmission complicating childbirth: Secondary | ICD-10-CM | POA: Diagnosis present

## 2016-02-12 DIAGNOSIS — O24420 Gestational diabetes mellitus in childbirth, diet controlled: Secondary | ICD-10-CM | POA: Diagnosis present

## 2016-02-12 DIAGNOSIS — O98319 Other infections with a predominantly sexual mode of transmission complicating pregnancy, unspecified trimester: Secondary | ICD-10-CM

## 2016-02-12 DIAGNOSIS — O24419 Gestational diabetes mellitus in pregnancy, unspecified control: Secondary | ICD-10-CM

## 2016-02-12 DIAGNOSIS — O0990 Supervision of high risk pregnancy, unspecified, unspecified trimester: Secondary | ICD-10-CM

## 2016-02-12 DIAGNOSIS — A6000 Herpesviral infection of urogenital system, unspecified: Secondary | ICD-10-CM | POA: Diagnosis present

## 2016-02-12 DIAGNOSIS — A6009 Herpesviral infection of other urogenital tract: Secondary | ICD-10-CM

## 2016-02-12 LAB — TYPE AND SCREEN
ABO/RH(D): AB POS
Antibody Screen: NEGATIVE

## 2016-02-12 LAB — CBC
HEMATOCRIT: 33 % — AB (ref 36.0–46.0)
HEMOGLOBIN: 11.5 g/dL — AB (ref 12.0–15.0)
MCH: 31.2 pg (ref 26.0–34.0)
MCHC: 34.8 g/dL (ref 30.0–36.0)
MCV: 89.4 fL (ref 78.0–100.0)
Platelets: 222 10*3/uL (ref 150–400)
RBC: 3.69 MIL/uL — ABNORMAL LOW (ref 3.87–5.11)
RDW: 14.6 % (ref 11.5–15.5)
WBC: 9 10*3/uL (ref 4.0–10.5)

## 2016-02-12 LAB — GLUCOSE, CAPILLARY: Glucose-Capillary: 93 mg/dL (ref 65–99)

## 2016-02-12 LAB — RPR: RPR Ser Ql: NONREACTIVE

## 2016-02-12 MED ORDER — FENTANYL CITRATE (PF) 100 MCG/2ML IJ SOLN
50.0000 ug | INTRAMUSCULAR | Status: DC | PRN
  Administered 2016-02-12: 50 ug via INTRAVENOUS

## 2016-02-12 MED ORDER — ACETAMINOPHEN 325 MG PO TABS: 650.0000 mg | ORAL_TABLET | ORAL | Status: DC | PRN

## 2016-02-12 MED ORDER — OXYCODONE-ACETAMINOPHEN 5-325 MG PO TABS: 2.0000 | ORAL_TABLET | ORAL | Status: DC | PRN

## 2016-02-12 MED ORDER — LIDOCAINE HCL (PF) 1 % IJ SOLN
30.0000 mL | INTRAMUSCULAR | Status: DC | PRN
  Filled 2016-02-12: qty 30

## 2016-02-12 MED ORDER — LACTATED RINGERS IV SOLN
INTRAVENOUS | Status: DC
  Administered 2016-02-12 (×3): via INTRAVENOUS

## 2016-02-12 MED ORDER — EPHEDRINE 5 MG/ML INJ
10.0000 mg | INTRAVENOUS | Status: DC | PRN
  Filled 2016-02-12: qty 4

## 2016-02-12 MED ORDER — SOD CITRATE-CITRIC ACID 500-334 MG/5ML PO SOLN: 30.0000 mL | ORAL | Status: DC | PRN

## 2016-02-12 MED ORDER — DIPHENHYDRAMINE HCL 50 MG/ML IJ SOLN: 12.5000 mg | INTRAMUSCULAR | Status: DC | PRN

## 2016-02-12 MED ORDER — ZOLPIDEM TARTRATE 5 MG PO TABS
5.0000 mg | ORAL_TABLET | Freq: Every evening | ORAL | Status: DC | PRN
  Administered 2016-02-12: 5 mg via ORAL
  Filled 2016-02-12: qty 1

## 2016-02-12 MED ORDER — OXYTOCIN BOLUS FROM INFUSION
500.0000 mL | Freq: Once | INTRAVENOUS | Status: AC
  Administered 2016-02-12: 500 mL via INTRAVENOUS

## 2016-02-12 MED ORDER — LACTATED RINGERS IV SOLN
500.0000 mL | INTRAVENOUS | Status: DC | PRN
  Administered 2016-02-12: 1000 mL via INTRAVENOUS

## 2016-02-12 MED ORDER — ONDANSETRON HCL 4 MG/2ML IJ SOLN
4.0000 mg | Freq: Four times a day (QID) | INTRAMUSCULAR | Status: DC | PRN
  Administered 2016-02-12: 4 mg via INTRAVENOUS
  Filled 2016-02-12: qty 2

## 2016-02-12 MED ORDER — LACTATED RINGERS IV SOLN: 500.0000 mL | Freq: Once | INTRAVENOUS | Status: DC

## 2016-02-12 MED ORDER — LACTATED RINGERS IV SOLN
500.0000 mL | Freq: Once | INTRAVENOUS | Status: AC
  Administered 2016-02-12: 1000 mL via INTRAVENOUS

## 2016-02-12 MED ORDER — MISOPROSTOL 25 MCG QUARTER TABLET
25.0000 ug | ORAL_TABLET | ORAL | Status: DC | PRN
  Administered 2016-02-12: 25 ug via VAGINAL
  Filled 2016-02-12: qty 1
  Filled 2016-02-12: qty 0.25

## 2016-02-12 MED ORDER — OXYCODONE-ACETAMINOPHEN 5-325 MG PO TABS: 1.0000 | ORAL_TABLET | ORAL | Status: DC | PRN

## 2016-02-12 MED ORDER — FENTANYL 2.5 MCG/ML BUPIVACAINE 1/10 % EPIDURAL INFUSION (WH - ANES)
14.0000 mL/h | INTRAMUSCULAR | Status: DC | PRN
  Filled 2016-02-12: qty 100

## 2016-02-12 MED ORDER — OXYTOCIN 40 UNITS IN LACTATED RINGERS INFUSION - SIMPLE MED: 2.5000 [IU]/h | INTRAVENOUS | Status: DC

## 2016-02-12 MED ORDER — PHENYLEPHRINE 40 MCG/ML (10ML) SYRINGE FOR IV PUSH (FOR BLOOD PRESSURE SUPPORT)
80.0000 ug | PREFILLED_SYRINGE | INTRAVENOUS | Status: DC | PRN
  Filled 2016-02-12: qty 5

## 2016-02-12 MED ORDER — TERBUTALINE SULFATE 1 MG/ML IJ SOLN
0.2500 mg | Freq: Once | INTRAMUSCULAR | Status: DC | PRN
  Filled 2016-02-12: qty 1

## 2016-02-12 MED ORDER — PHENYLEPHRINE 40 MCG/ML (10ML) SYRINGE FOR IV PUSH (FOR BLOOD PRESSURE SUPPORT)
80.0000 ug | PREFILLED_SYRINGE | INTRAVENOUS | Status: DC | PRN
  Filled 2016-02-12: qty 5
  Filled 2016-02-12: qty 10

## 2016-02-12 MED ORDER — FENTANYL CITRATE (PF) 100 MCG/2ML IJ SOLN
100.0000 ug | INTRAMUSCULAR | Status: DC | PRN
  Filled 2016-02-12: qty 2

## 2016-02-12 MED ORDER — FENTANYL 2.5 MCG/ML BUPIVACAINE 1/10 % EPIDURAL INFUSION (WH - ANES)
14.0000 mL/h | INTRAMUSCULAR | Status: DC | PRN
  Administered 2016-02-12: 14 mL/h via EPIDURAL
  Filled 2016-02-12: qty 100

## 2016-02-12 MED ORDER — OXYTOCIN 40 UNITS IN LACTATED RINGERS INFUSION - SIMPLE MED
1.0000 m[IU]/min | INTRAVENOUS | Status: DC
Start: 2016-02-12 — End: 2016-02-13
  Administered 2016-02-12: 2 m[IU]/min via INTRAVENOUS
  Filled 2016-02-12: qty 1000

## 2016-02-12 MED ORDER — LIDOCAINE HCL (PF) 1 % IJ SOLN
INTRAMUSCULAR | Status: DC | PRN
  Administered 2016-02-12: 4 mL
  Administered 2016-02-12: 6 mL via EPIDURAL

## 2016-02-12 NOTE — Anesthesia Preprocedure Evaluation (Addendum)

## 2016-02-12 NOTE — Anesthesia Procedure Notes (Signed)

## 2016-02-12 NOTE — H&P (Signed)
Teresa Massey is a 35 y.o. female [phone] @ 39.0 wks  presenting for IOL due to GDM A1. Her preg has been followed by the [facility] office and has been remarkable for 1) HSV- no outbreaks 2) tx of care from [place] at 33wks 3) GBS neg. There is not an U/S for EFW- last babies were 5-6lbs.  OB History    Gravida Para Term Preterm AB Living   4 2 2   1 2    SAB TAB Ectopic Multiple Live Births     1   0 2     Past Medical History:  Diagnosis Date  . Anemia   . Genital herpes   . H/O eye surgery    bil "lazy" eyes  . Hx of bilateral breast reduction surgery 2009  . Hx of cholecystectomy 2005  . Kidney stones 2008, 2011   Past Surgical History:  Procedure Laterality Date  . BREAST REDUCTION SURGERY  2009  . CHOLECYSTECTOMY  2005  . EYE SURGERY     for a "lazy" eye (both eyes)   Family History: family history includes Asthma in her father; Cancer in her paternal grandmother; Hypertension in her father. Social History:  reports that she has never smoked. She has never used smokeless tobacco. She reports that she does not drink alcohol or use drugs.     Maternal Diabetes: Yes:  Diabetes Type:  Diet controlled Genetic Screening: Declined Maternal Ultrasounds/Referrals: Normal Fetal Ultrasounds or other Referrals:  None Maternal Substance Abuse:  No Significant Maternal Medications:  None Significant Maternal Lab Results:  Lab values include: Group B Strep negative Other Comments:  tx of care from [place] at 33wks  ROS History Dilation: 1 Effacement (%): 40 Station: -3 Exam by:: B Boyer RN Blood pressure 125/72, pulse (!) 102, temperature 97.7 F (36.5 C), temperature source Oral, resp. rate 16, height 5\' 4"  (1.626 m), weight 91.2 kg (201 lb), last menstrual period 05/14/2015, unknown if currently breastfeeding. Exam Physical Exam  Constitutional: She is oriented to person, place, and time. She appears well-developed.  HENT:  Head: Normocephalic.  Neck: Normal range of  motion.  Cardiovascular: Normal rate.   Respiratory: Effort normal.  GI:  EFM 130s, +accels, no decels Ctx irreg  Musculoskeletal: Normal range of motion.  Neurological: She is alert and oriented to person, place, and time.  Skin: Skin is warm and dry.  Psychiatric: She has a normal mood and affect. Her behavior is normal. Thought content normal.    Prenatal labs: ABO, Rh: --/--/AB POS (11/30 0143) Antibody: NEG (11/30 0143) Rubella: 1.29 (10/19 1439) RPR: NON REAC (10/19 1439)  HBsAg: NEGATIVE (10/19 1439)  HIV: NONREACTIVE (10/19 1439)  GBS: Negative (11/06 0000)   Assessment/Plan: IUP@term  GDM A1 Cx unfavorable  [facility] Start w/ cytotec for cx ripening and then progress to FB/Pit Anticipate SVD   SHAW, KIMBERLY CNM 02/12/2016, 4:17 AM

## 2016-02-12 NOTE — Progress Notes (Addendum)
AROM w/clear fluid. Cx 6/90/-1.  FHR Cat 1.  Ctx q 2 minutes. Comfortable w epidural.

## 2016-02-12 NOTE — Progress Notes (Signed)
Foley bulb placed, tolerated well.  Pt resting comfortably with family at bedside. Mild uterine contractions q 2-5 minutes. FHT baseline 130, positive accels, no decels. Continue expectation management, anticipate SVD.  I have seen and examined this patient and I agree with the above. Cam HaiSHAW, KIMBERLY CNM 7:19 AM 02/12/2016

## 2016-02-12 NOTE — Anesthesia Pain Management Evaluation Note (Signed)
  [facility] Pain Management Visit Note  Patient: Teresa Massey, 35 y.o., female  "Hello I am a member of the anest[facility]Tomma Lightningesia team at [facility]. We have an anesthesia team available at all times to provide care throughout the hospital, including epidural management and anesthesia for C-section. I don't know your plan for the delivery whether it a natural birth, water birth, IV sedation, nitrous supplementation, doula or epidural, but we want to meet your pain goals."   1.Was your pain managed to your expectations on prior hospitalizations?   Yes   2.What is your expectation for pain management during this hospitalization?     Epidural  3.How can we help you reach that goal? Epidural   Record the patient's initial score and the patient's pain goal.   Pain: 1  Pain Goal: 6 [facility] wants you to be able to say your pain was always managed very well.  Mansfield Dann 02/12/2016

## 2016-02-13 MED ORDER — OXYCODONE HCL 5 MG PO TABS: 10.0000 mg | ORAL_TABLET | ORAL | Status: DC | PRN

## 2016-02-13 MED ORDER — FLEET ENEMA 7-19 GM/118ML RE ENEM: 1.0000 | ENEMA | Freq: Every day | RECTAL | Status: DC | PRN

## 2016-02-13 MED ORDER — SIMETHICONE 80 MG PO CHEW: 80.0000 mg | CHEWABLE_TABLET | ORAL | Status: DC | PRN

## 2016-02-13 MED ORDER — FERROUS SULFATE 325 (65 FE) MG PO TABS
325.0000 mg | ORAL_TABLET | Freq: Two times a day (BID) | ORAL | Status: DC
  Administered 2016-02-13 – 2016-02-14 (×3): 325 mg via ORAL
  Filled 2016-02-13 (×3): qty 1

## 2016-02-13 MED ORDER — TETANUS-DIPHTH-ACELL PERTUSSIS 5-2.5-18.5 LF-MCG/0.5 IM SUSP: 0.5000 mL | Freq: Once | INTRAMUSCULAR | Status: DC

## 2016-02-13 MED ORDER — BENZOCAINE-MENTHOL 20-0.5 % EX AERO: 1.0000 "application " | INHALATION_SPRAY | CUTANEOUS | Status: DC | PRN

## 2016-02-13 MED ORDER — METHYLERGONOVINE MALEATE 0.2 MG PO TABS: 0.2000 mg | ORAL_TABLET | ORAL | Status: DC | PRN

## 2016-02-13 MED ORDER — WITCH HAZEL-GLYCERIN EX PADS: 1.0000 "application " | MEDICATED_PAD | CUTANEOUS | Status: DC | PRN

## 2016-02-13 MED ORDER — ONDANSETRON HCL 4 MG PO TABS: 4.0000 mg | ORAL_TABLET | ORAL | Status: DC | PRN

## 2016-02-13 MED ORDER — DIPHENHYDRAMINE HCL 25 MG PO CAPS: 25.0000 mg | ORAL_CAPSULE | Freq: Four times a day (QID) | ORAL | Status: DC | PRN

## 2016-02-13 MED ORDER — PRENATAL MULTIVITAMIN CH
1.0000 | ORAL_TABLET | Freq: Every day | ORAL | Status: DC
  Administered 2016-02-13 – 2016-02-14 (×2): 1 via ORAL
  Filled 2016-02-13 (×2): qty 1

## 2016-02-13 MED ORDER — COCONUT OIL OIL
1.0000 "application " | TOPICAL_OIL | Status: DC | PRN
  Administered 2016-02-14: 1 via TOPICAL
  Filled 2016-02-13: qty 120

## 2016-02-13 MED ORDER — ONDANSETRON HCL 4 MG/2ML IJ SOLN: 4.0000 mg | INTRAMUSCULAR | Status: DC | PRN

## 2016-02-13 MED ORDER — ZOLPIDEM TARTRATE 5 MG PO TABS: 5.0000 mg | ORAL_TABLET | Freq: Every evening | ORAL | Status: DC | PRN

## 2016-02-13 MED ORDER — METHYLERGONOVINE MALEATE 0.2 MG/ML IJ SOLN: 0.2000 mg | INTRAMUSCULAR | Status: DC | PRN

## 2016-02-13 MED ORDER — DIBUCAINE 1 % RE OINT: 1.0000 "application " | TOPICAL_OINTMENT | RECTAL | Status: DC | PRN

## 2016-02-13 MED ORDER — OXYCODONE HCL 5 MG PO TABS
5.0000 mg | ORAL_TABLET | ORAL | Status: DC | PRN
  Administered 2016-02-13 – 2016-02-14 (×5): 5 mg via ORAL
  Filled 2016-02-13 (×6): qty 1

## 2016-02-13 MED ORDER — BISACODYL 10 MG RE SUPP: 10.0000 mg | Freq: Every day | RECTAL | Status: DC | PRN

## 2016-02-13 MED ORDER — ACETAMINOPHEN 325 MG PO TABS
650.0000 mg | ORAL_TABLET | ORAL | Status: DC | PRN
  Administered 2016-02-13 – 2016-02-14 (×5): 650 mg via ORAL
  Filled 2016-02-13 (×5): qty 2

## 2016-02-13 MED ORDER — IBUPROFEN 600 MG PO TABS
600.0000 mg | ORAL_TABLET | Freq: Four times a day (QID) | ORAL | Status: DC
  Administered 2016-02-13 – 2016-02-14 (×7): 600 mg via ORAL
  Filled 2016-02-13 (×7): qty 1

## 2016-02-13 MED ORDER — DOCUSATE SODIUM 100 MG PO CAPS
100.0000 mg | ORAL_CAPSULE | Freq: Two times a day (BID) | ORAL | Status: DC
  Administered 2016-02-13 – 2016-02-14 (×3): 100 mg via ORAL
  Filled 2016-02-13 (×3): qty 1

## 2016-02-13 MED ORDER — MEASLES, MUMPS & RUBELLA VAC ~~LOC~~ INJ
0.5000 mL | INJECTION | Freq: Once | SUBCUTANEOUS | Status: DC
  Filled 2016-02-13: qty 0.5

## 2016-02-13 NOTE — Anesthesia Postprocedure Evaluation (Signed)
Anesthesia Post Note  Patient: Teresa Massey  Procedure(s) Performed: * No procedures listed *  Patient location during evaluation: Mother Baby Anesthesia Type: Epidural Level of consciousness: awake Pain management: pain level controlled Vital Signs Assessment: post-procedure vital signs reviewed and stable Respiratory status: spontaneous breathing Cardiovascular status: stable and blood pressure returned to baseline Postop Assessment: no headache, no backache, epidural receding and patient able to bend at knees Anesthetic complications: no     Last Vitals:  Vitals:   02/13/16 0030 02/13/16 0415  BP: 126/68 (!) 105/59  Pulse: (!) 110 95  Resp: 18 18  Temp: 37.4 C 37.2 C    Last Pain:  Vitals:   02/13/16 0714  TempSrc:   PainSc: 2    Pain Goal: Patients Stated Pain Goal: 8 (02/12/16 0800)               Edison PaceWILKERSON,Naasia Weilbacher

## 2016-02-13 NOTE — Lactation Note (Addendum)
This note was copied from a baby's chart. Lactation Consultation Note  Patient Name: Teresa Massey [id number]'s Date: 02/13/2016 Reason for consult: Initial assessment Baby is 17 hours old and has been to the breast several times since birth and one  Small supplement. Per mom the baby seemed very hungry during the night so I wanted to fill her  Up. Per mom I plan to continue to only breast feed. Baby last fed at 1335. And LC noted the baby to be sleeping.  Baby has had 2 latch scores score 10 x 2 . LC encouraged mom to page for feeding cues for feeding assessment. LC instructed mom on a hand pump for D/C tomorrow.  Per mom with her 2nd baby the #24 flange was comfortable. Sore nipple and  Engorgement prevention and tx reviewed.  Mother informed of post-discharge support and given phone number to the lactation department, including services  for phone call assistance; out-patient appointments; and breastfeeding support group. List of other breastfeeding resourcesin the community given in the handout. Encouraged mother to call for problems or concerns related to breastfeeding.    Maternal Data Does the patient have breastfeeding experience prior to this delivery?: Yes  Feeding Feeding Type:  (per mom baby recently fed 1335 , and presently sleeping in moms arms ) Length of feed: 25 min (mom reports swallows )  LATCH Score/Interventions ( this Latch score was done by the [facility] caring for mom.  Latch: Grasps breast easily, tongue down, lips flanged, rhythmical sucking.  Audible Swallowing: Spontaneous and intermittent  Type of Nipple: Everted at rest and after stimulation  Comfort (Breast/Nipple): Soft / non-tender     Hold (Positioning): No assistance needed to correctly position infant at breast.  LATCH Score: 10  Lactation Tools Discussed/Used Tools: Pump (per mom used the #24 Flange with her 2nd abby and it was comfortable ) Breast pump type: Manual WIC Program:  No   Consult Status Consult Status: Follow-up Date: 02/13/16 Follow-up type: In-patient    Teresa Massey 02/13/2016, 3:15 PM

## 2016-02-13 NOTE — Progress Notes (Signed)
POSTPARTUM PROGRESS NOTE  Post Partum Day 1 Subjective:  Tomma LightningMaurine B Lichtman is a 35 y.o. [id number] 4660w0d s/p SVD.  No acute events overnight.  Pt denies problems with ambulating, voiding or po intake.  She denies nausea or vomiting.  Pain is well controlled.  She has had flatus. She has not had bowel movement.  Lochia Small.   Objective: Blood pressure (!) 113/57, pulse 93, temperature 98.2 F (36.8 C), resp. rate 20, height 5\' 4"  (1.626 m), weight 201 lb (91.2 kg), last menstrual period 05/14/2015, SpO2 96 %, unknown if currently breastfeeding.  Physical Exam:  General: alert, cooperative and no distress Lochia:normal flow Chest: no respiratory distress Heart:regular rate, distal pulses intact Abdomen: soft, nontender,  Uterine Fundus: firm, appropriately tender DVT Evaluation: No calf swelling or tenderness Extremities: trace edema   Recent Labs  02/12/16 0143  HGB 11.5*  HCT 33.0*    Assessment/Plan:  ASSESSMENT: Tomma LightningMaurine B Sole is a 35 y.o. [id number] 1660w0d s/p SVD  Plan for discharge tomorrow and Breastfeeding   LOS: 1 day   Les Pouicholas SchenkMD 02/13/2016, 10:34 AM

## 2016-02-13 NOTE — Lactation Note (Signed)
This note was copied from a baby's chart. Mom requesting to Breast & Bottle feed baby during admission.  LEAD explained, Mother states understanding, explained need to offer breast first, then supplement as she feels is necessary. Formula info given with supplementation guidelines.  Mother states understanding.

## 2016-02-14 LAB — GLUCOSE, CAPILLARY: Glucose-Capillary: 87 mg/dL (ref 65–99)

## 2016-02-14 MED ORDER — IBUPROFEN 600 MG PO TABS: 600.0000 mg | ORAL_TABLET | Freq: Four times a day (QID) | ORAL | 0 refills | Status: DC

## 2016-02-14 NOTE — Discharge Instructions (Signed)

## 2016-02-14 NOTE — Discharge Summary (Signed)
OB Discharge Summary     Patient Name: Teresa Massey DOB: 06-Oct-1980 MRN: [id number]  Date of admission: 02/12/2016 Delivering MD: Jacklyn ShellRESENZO-DISHMON, FRANCES   Date of discharge: 02/14/2016  Admitting diagnosis: 39wks, induction Intrauterine pregnancy: 9182w0d     Secondary diagnosis:  Active Problems:   GDM, class A1  Additional problems: none     Discharge diagnosis: Term Pregnancy Delivered and GDM A1                                                                                                Post partum procedures:none  Augmentation: AROM, Pitocin, Cytotec and Foley Balloon  Complications: None  Hospital course:  Induction of Labor With Vaginal Delivery   35 y.o. yo [phone] at 4982w0d was admitted to the hospital 02/12/2016 for induction of labor.  Indication for induction: A1 DM.  Patient had an uncomplicated labor course as follows: Membrane Rupture Time/Date: 8:27 PM ,02/12/2016   Intrapartum Procedures: Episiotomy: None [1]                                         Lacerations:  None [1]  Patient had delivery of a Viable infant.  Information for the patient's newborn:  Lauretta Grillierre, Girl Caroljean [[id number]][[id number]]  Delivery Method: Vaginal, Spontaneous Delivery (Filed from Delivery Summary)   02/12/2016  Details of delivery can be found in separate delivery note.  Patient had a routine postpartum course. Patient is discharged home 02/14/16.   Physical exam Vitals:   02/13/16 0415 02/13/16 1024 02/13/16 1657 02/14/16 0621  BP: (!) 105/59 (!) 113/57 (!) 101/54 120/76  Pulse: 95 93 87 99  Resp: 18 20 18 18   Temp: 99 F (37.2 C) 98.2 F (36.8 C) 98.1 F (36.7 C) 97.9 F (36.6 C)  TempSrc: Oral   Oral  SpO2:  96%    Weight:      Height:       General: alert and cooperative Lochia: appropriate Uterine Fundus: firm Incision: N/A DVT Evaluation: No evidence of DVT seen on physical exam. Labs: Lab Results  Component Value Date   WBC 9.0 02/12/2016   HGB 11.5 (L)  02/12/2016   HCT 33.0 (L) 02/12/2016   MCV 89.4 02/12/2016   PLT 222 02/12/2016   CMP Latest Ref Rng & Units 01/20/2016  Glucose 65 - 99 mg/dL 96  BUN 6 - 20 mg/dL 6  Creatinine 1.300.44 - 8.651.00 mg/dL 7.840.45  Sodium 696135 - 295145 mmol/L 136  Potassium 3.5 - 5.1 mmol/L 3.8  Chloride 101 - 111 mmol/L 106  CO2 22 - 32 mmol/L 21(L)  Calcium 8.9 - 10.3 mg/dL 2.8(U8.8(L)  Total Protein 6.5 - 8.1 g/dL -  Total Bilirubin 0.3 - 1.2 mg/dL -  Alkaline Phos 38 - 132126 U/L -  AST 15 - 41 U/L -  ALT 14 - 54 U/L -    Discharge instruction: per After Visit Summary and "Baby and Me Booklet".  After visit meds:    Medication List  STOP taking these medications   accu-chek softclix lancets   glucose blood test strip Commonly known as:  ACCU-CHEK AVIVA PLUS   valACYclovir 1000 MG tablet Commonly known as:  VALTREX     TAKE these medications   butalbital-acetaminophen-caffeine 50-325-40 MG tablet Commonly known as:  FIORICET, ESGIC Take 1-2 tablets by mouth every 6 (six) hours as needed for headache.   ibuprofen 600 MG tablet Commonly known as:  ADVIL,MOTRIN Take 1 tablet (600 mg total) by mouth every 6 (six) hours.   prenatal multivitamin Tabs tablet Take 1 tablet by mouth daily.       Diet: routine diet  Activity: Advance as tolerated. Pelvic rest for 6 weeks.   Outpatient follow up:6 weeks Follow up Appt:Future Appointments Date Time Provider Department Center  03/01/2016 10:20 AM Tereso NewcomerUgonna A Anyanwu, MD [facility]   Follow up Visit:No Follow-up on file.  Postpartum contraception: Vasectomy  Newborn Data: Live born female  Birth Weight: 7 lb 11.1 oz (3490 g) APGAR: 9, 9  Baby Feeding: Breast Disposition:home with mother   02/14/2016 Cam HaiSHAW, Titianna Loomis, CNM  9:32 AM

## 2016-03-01 ENCOUNTER — Ambulatory Visit (INDEPENDENT_AMBULATORY_CARE_PROVIDER_SITE_OTHER): Payer: Medicaid Other | Admitting: Obstetrics & Gynecology

## 2016-03-01 ENCOUNTER — Encounter: Payer: Self-pay | Admitting: Obstetrics & Gynecology

## 2016-03-01 DIAGNOSIS — O24410 Gestational diabetes mellitus in pregnancy, diet controlled: Secondary | ICD-10-CM

## 2016-03-01 NOTE — Patient Instructions (Signed)
Return to clinic for any scheduled appointments or for any gynecologic concerns as needed.   

## 2016-03-01 NOTE — Progress Notes (Signed)
Subjective:     Teresa Massey is a 35 y.o. [phone] female who presents for a postpartum visit. She is 2 weeks postpartum following a spontaneous vaginal delivery; she wanted this appointment early as she is returning to [place] soon. I have fully reviewed the prenatal and intrapartum course; she had A1GDM. The delivery was at 39 gestational weeks. Outcome: spontaneous vaginal delivery. Anesthesia: epidural. Postpartum course has been uncomplicated. Baby's course has been uncomplicated. Baby is feeding by breast. Bleeding thin lochia. Bowel function is normal. Bladder function is normal. Patient is not sexually active. Contraception method is none. Postpartum depression screening: negative.  The following portions of the patient's history were reviewed and updated as appropriate: allergies, current medications, past family history, past medical history, past social history, past surgical history and problem list.  Review of Systems Pertinent items noted in HPI and remainder of comprehensive ROS otherwise negative.   Objective:    Ht 5\' 4"  (1.626 m)   Wt 173 lb 3.2 oz (78.6 kg)   Breastfeeding? Yes   BMI 29.73 kg/m   General:  alert and no distress   Breasts:  inspection negative, no nipple discharge or bleeding, no masses or nodularity palpable  Lungs: clear to auscultation bilaterally  Heart:  regular rate and rhythm  Abdomen: soft, non-tender; bowel sounds normal; no masses,  no organomegaly  Pelvic:  not evaluated        Assessment:    Normal postpartum exam.  Plan:   1. Contraception: vasectomy 2. Patient urged to follow up with OB/PCP in [place] for 2 hr GTT given h/o GDM 3. Follow up as needed.    Jaynie CollinsUGONNA  ANYANWU, MD, FACOG Attending Obstetrician & Gynecologist, [facility], [facility]

## 2016-05-28 IMAGING — US US MFM OB TRANSVAGINAL
2 of 3 series · 12 of 28 positions shown · non-contrast
Comparison: none

[Series 1: us ob comp +14 wk mfm · 1 of 9 slices shown (1 of 2)]
[im 5/9]
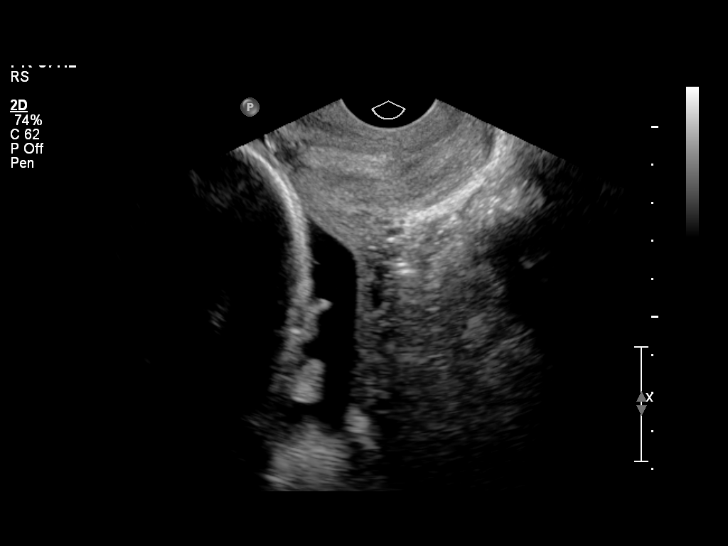

[Series 1: us ob comp +14 wk mfm · 11 of 66 slices shown (2 of 2)]
[im 1/66]
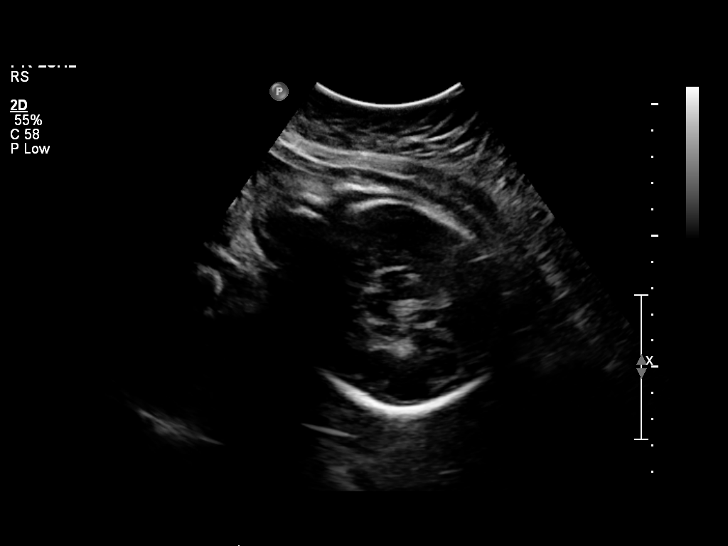
[im 6/66]
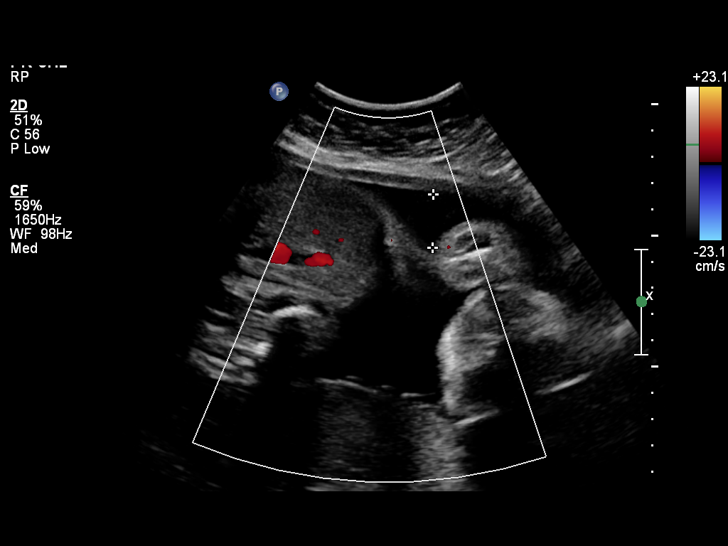
[im 15/66]
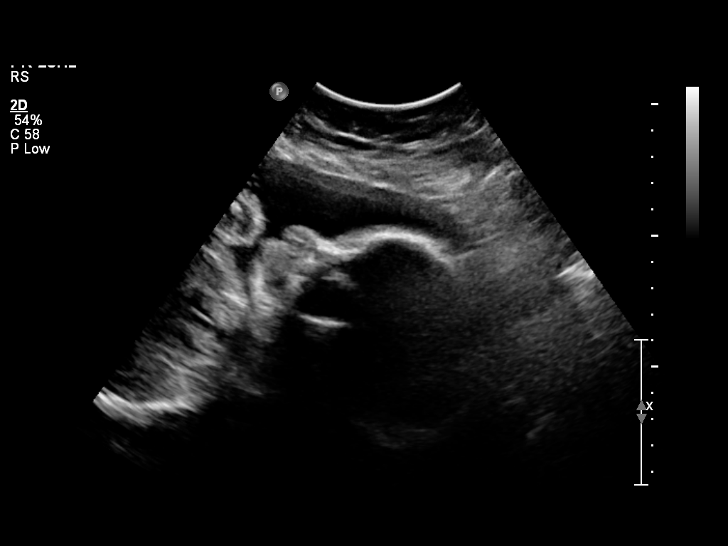
[im 20/66]
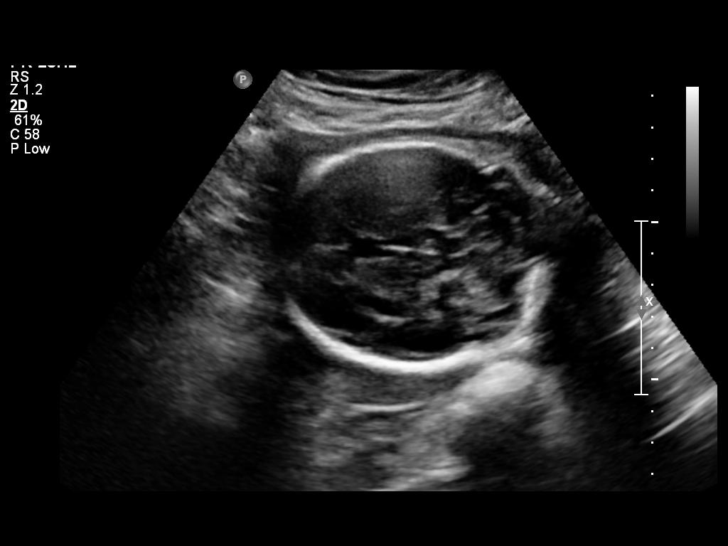
[im 26/66]
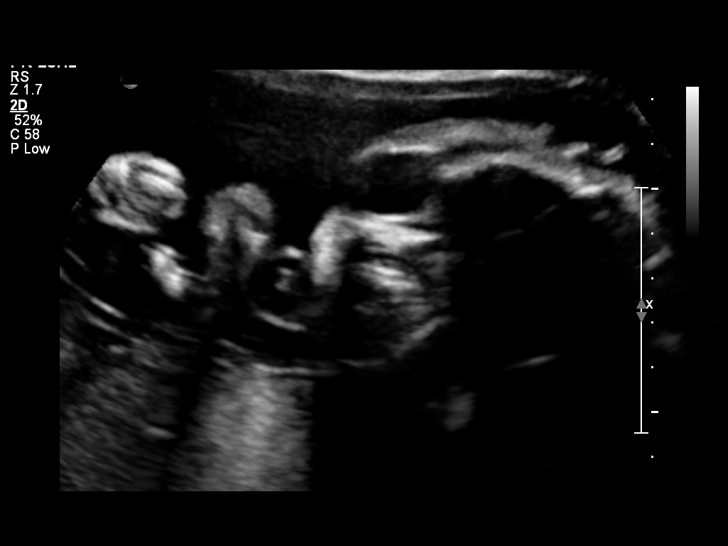
[im 34/66]
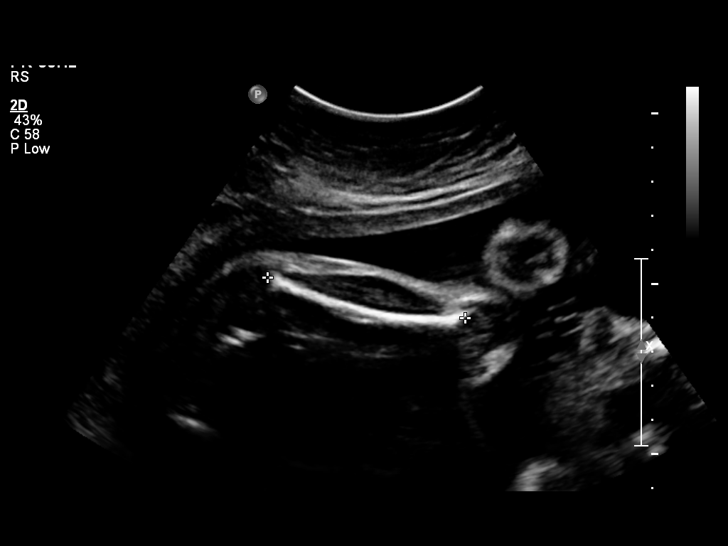
[im 40/66]
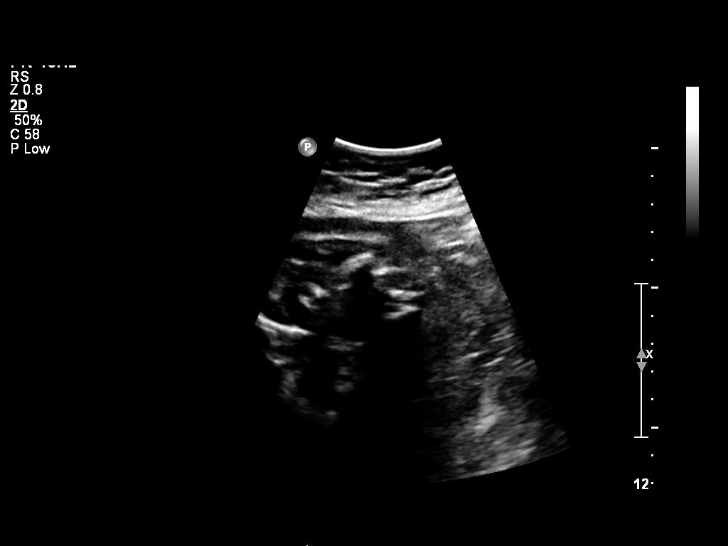
[im 46/66]
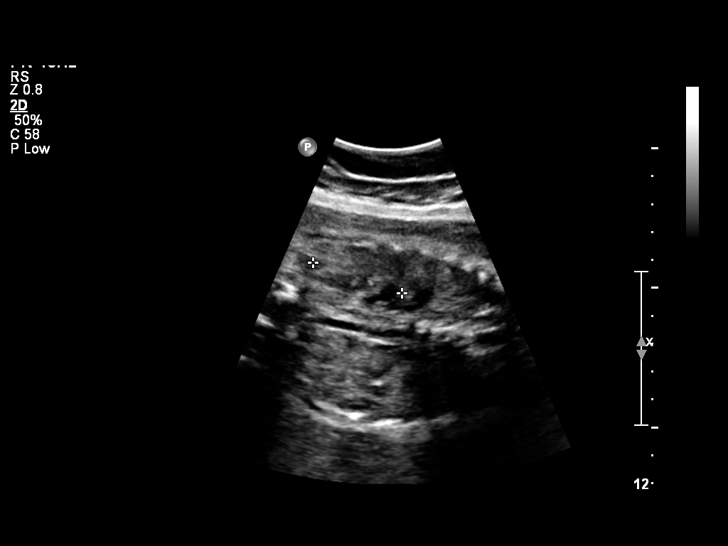
[im 54/66]
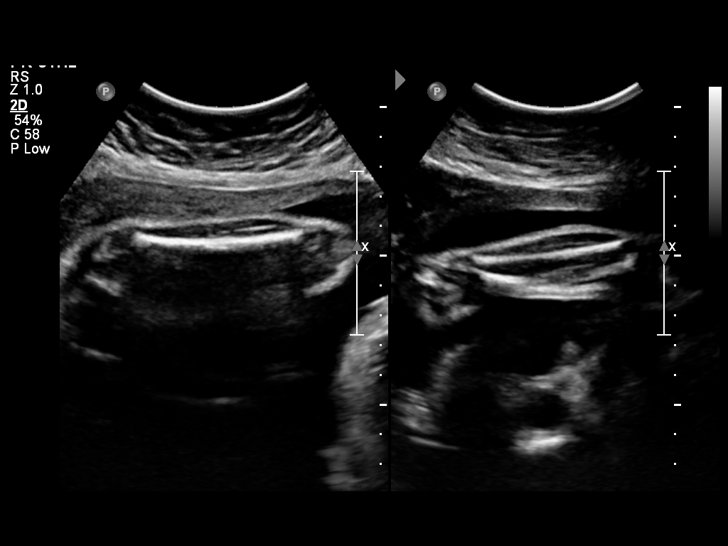
[im 60/66]
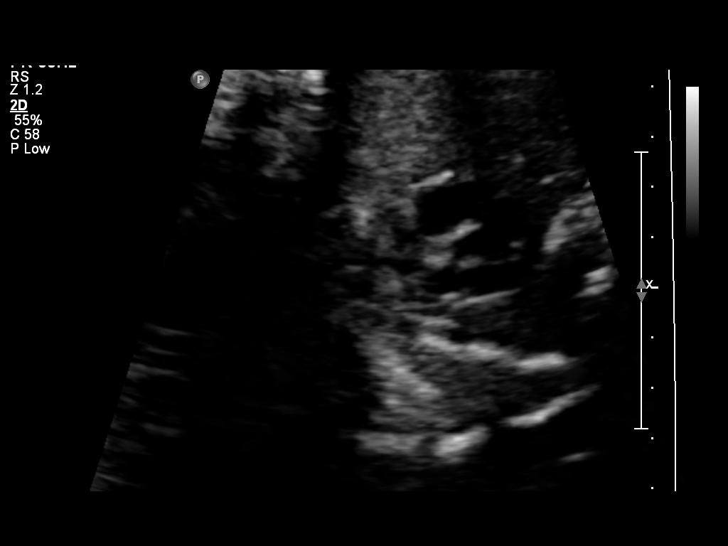
[im 66/66]
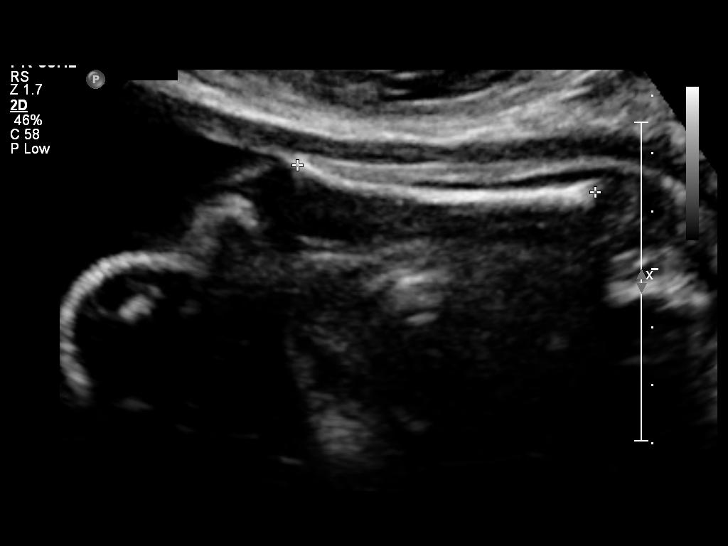

[12 of 28 positions shown; findings below may reference images not displayed]

OBSTETRICS REPORT
                      (Signed Final 03/07/2014 [DATE])

Service(s) Provided

 US OB COMP + 14 WK                                    76805.1
 US MFM OB TRANSVAGINAL                                76817.2
 US UA CORD DOPPLER                                    76820.0
Indications

 Placenta previa/Low lying: Bleeding (with activity)
 30 weeks gestation of pregnancy
 Basic anatomic survey                                 z36
Fetal Evaluation

 Num Of Fetuses:    1
 Fetal Heart Rate:  150                          bpm
 Cardiac Activity:  Observed
 Presentation:      Cephalic
 Placenta:          Posterior, above cervical
                    os
 P. Cord            Visualized, central
 Insertion:

 Amniotic Fluid
 AFI FV:      Subjectively within normal limits
 AFI Sum:     14.76   cm       52  %Tile     Larg Pckt:    5.13  cm
 RUQ:   5.13    cm   RLQ:    4.53   cm    LUQ:   2.02    cm   LLQ:    3.08   cm
Biometry

 BPD:     72.4  mm     G. Age:  29w 0d                CI:        72.52   70 - 86
                                                      FL/HC:      22.2   19.3 -

 HC:     270.4  mm     G. Age:  29w 3d      < 3  %    HC/AC:      1.10   0.96 -

 AC:     245.4  mm     G. Age:  28w 5d        6  %    FL/BPD:     82.9   71 - 87
 FL:        60  mm     G. Age:  31w 1d       50  %    FL/AC:      24.4   20 - 24
 HUM:     52.1  mm     G. Age:  30w 3d       46  %

 Est. FW:    4696  gm      3 lb 3 oz     33  %
Gestational Age

 LMP:           30w 5d        Date:  08/03/13                 EDD:   05/10/14
 U/S Today:     29w 4d                                        EDD:   05/18/14
 Best:          30w 5d     Det. By:  LMP  (08/03/13)          EDD:   05/10/14
Anatomy

 Cranium:          Appears normal         Aortic Arch:      Not well visualized
 Fetal Cavum:      Appears normal         Ductal Arch:      Not well visualized
 Ventricles:       Appears normal         Diaphragm:        Appears normal
 Choroid Plexus:   Appears normal         Stomach:          Appears normal, left
                                                            sided
 Cerebellum:       Appears normal         Abdomen:          Appears normal
 Posterior Fossa:  Appears normal         Abdominal Wall:   Not well visualized
 Nuchal Fold:      Not applicable (>20    Cord Vessels:     Appears normal (3
                   wks GA)                                  vessel cord)
 Face:             Orbits appear          Kidneys:          Appear normal
                   normal
 Lips:             Appears normal         Bladder:          Appears normal
 Heart:            Not well visualized    Spine:            Appears normal
 RVOT:             Appears normal         Lower             Appears normal
                                          Extremities:
 LVOT:             Appears normal         Upper             Appears normal
                                          Extremities:

 Other:  Fetus appears to be a female. Nasal bone visualized. Technically
         difficult due to advanced GA and fetal position.
Doppler - Fetal Vessels

 Umbilical Artery
 S/D:   3.3            73  %tile
 Umbilical Artery
 Absent DFV:    No     Reverse DFV:    No

Cervix Uterus Adnexa

 Cervical Length:    4.5      cm

 Cervix:       Normal appearance by transabdominal scan.
Impression

 SIUP at 30+5 weeks
 Normal detailed fetal anatomy; limited views of heart and CI
 Normal amniotic fluid volume
 EFW at the 33rd %tile; AC at the 6th %tile
 EV views of cervix: normal length without funneling; no
 previa; no SCH identified
 UA dopplers were normal for this GA
Recommendations

 Follow-up ultrasound for growth in 3 weeks

## 2017-06-20 ENCOUNTER — Inpatient Hospital Stay (HOSPITAL_COMMUNITY)
Admission: AD | Admit: 2017-06-20 | Discharge: 2017-06-21 | Disposition: A | Discharge status: Home / Self Care | Payer: Medicaid Other | Source: Ambulatory Visit | Attending: Emergency Medicine | Admitting: Emergency Medicine

## 2017-06-20 DIAGNOSIS — N83201 Unspecified ovarian cyst, right side: Secondary | ICD-10-CM | POA: Insufficient documentation

## 2017-06-20 DIAGNOSIS — N83202 Unspecified ovarian cyst, left side: Secondary | ICD-10-CM | POA: Insufficient documentation

## 2017-06-20 DIAGNOSIS — Z79899 Other long term (current) drug therapy: Secondary | ICD-10-CM | POA: Diagnosis not present

## 2017-06-20 DIAGNOSIS — Z3202 Encounter for pregnancy test, result negative: Secondary | ICD-10-CM | POA: Diagnosis not present

## 2017-06-20 DIAGNOSIS — R1084 Generalized abdominal pain: Secondary | ICD-10-CM | POA: Diagnosis present

## 2017-06-20 DIAGNOSIS — R140 Abdominal distension (gaseous): Secondary | ICD-10-CM | POA: Diagnosis not present

## 2017-06-20 DIAGNOSIS — K5900 Constipation, unspecified: Secondary | ICD-10-CM | POA: Diagnosis not present

## 2017-06-21 ENCOUNTER — Inpatient Hospital Stay (HOSPITAL_COMMUNITY): Payer: Medicaid Other

## 2017-06-21 ENCOUNTER — Encounter (HOSPITAL_COMMUNITY): Payer: Self-pay | Admitting: *Deleted

## 2017-06-21 ENCOUNTER — Other Ambulatory Visit: Payer: Self-pay

## 2017-06-21 DIAGNOSIS — K5900 Constipation, unspecified: Secondary | ICD-10-CM | POA: Diagnosis not present

## 2017-06-21 DIAGNOSIS — Z3202 Encounter for pregnancy test, result negative: Secondary | ICD-10-CM | POA: Diagnosis not present

## 2017-06-21 DIAGNOSIS — Z79899 Other long term (current) drug therapy: Secondary | ICD-10-CM | POA: Diagnosis not present

## 2017-06-21 DIAGNOSIS — R140 Abdominal distension (gaseous): Secondary | ICD-10-CM | POA: Diagnosis not present

## 2017-06-21 DIAGNOSIS — R1084 Generalized abdominal pain: Secondary | ICD-10-CM | POA: Diagnosis present

## 2017-06-21 DIAGNOSIS — N83201 Unspecified ovarian cyst, right side: Secondary | ICD-10-CM | POA: Diagnosis not present

## 2017-06-21 DIAGNOSIS — N83202 Unspecified ovarian cyst, left side: Secondary | ICD-10-CM | POA: Diagnosis not present

## 2017-06-21 LAB — COMPREHENSIVE METABOLIC PANEL
ALK PHOS: 61 U/L (ref 38–126)
ALT: 17 U/L (ref 14–54)
AST: 17 U/L (ref 15–41)
Albumin: 3.9 g/dL (ref 3.5–5.0)
Anion gap: 8 (ref 5–15)
BUN: 17 mg/dL (ref 6–20)
CALCIUM: 8.9 mg/dL (ref 8.9–10.3)
CO2: 25 mmol/L (ref 22–32)
CREATININE: 0.61 mg/dL (ref 0.44–1.00)
Chloride: 105 mmol/L (ref 101–111)
GFR calc Af Amer: >60 mL/min (ref >60)
Glucose, Bld: 100 mg/dL — ABNORMAL HIGH (ref 65–99)
Potassium: 4.1 mmol/L (ref 3.5–5.1)
Sodium: 138 mmol/L (ref 135–145)
TOTAL PROTEIN: 7.8 g/dL (ref 6.5–8.1)

## 2017-06-21 LAB — URINALYSIS, ROUTINE W REFLEX MICROSCOPIC
Bilirubin Urine: NEGATIVE
Glucose, UA: NEGATIVE mg/dL
Hgb urine dipstick: NEGATIVE
Ketones, ur: NEGATIVE mg/dL
Leukocytes, UA: NEGATIVE
NITRITE: NEGATIVE
PH: 7 (ref 5.0–8.0)
Protein, ur: NEGATIVE mg/dL
Specific Gravity, Urine: 1.009 (ref 1.005–1.030)

## 2017-06-21 LAB — CBC
HEMATOCRIT: 38.5 % (ref 36.0–46.0)
Hemoglobin: 12.8 g/dL (ref 12.0–15.0)
MCH: 29.8 pg (ref 26.0–34.0)
MCHC: 33.2 g/dL (ref 30.0–36.0)
MCV: 89.7 fL (ref 78.0–100.0)
Platelets: 341 10*3/uL (ref 150–400)
RBC: 4.29 MIL/uL (ref 3.87–5.11)
RDW: 13.6 % (ref 11.5–15.5)
WBC: 8.2 10*3/uL (ref 4.0–10.5)

## 2017-06-21 LAB — POCT PREGNANCY, URINE: PREG TEST UR: NEGATIVE

## 2017-06-21 MED ORDER — TRAMADOL HCL 50 MG PO TABS: 50.0000 mg | ORAL_TABLET | Freq: Four times a day (QID) | ORAL | 0 refills | Status: DC | PRN

## 2017-06-21 MED ORDER — IOPAMIDOL (ISOVUE-300) INJECTION 61%
INTRAVENOUS | Status: AC
  Filled 2017-06-21: qty 100

## 2017-06-21 MED ORDER — HYDROMORPHONE HCL 1 MG/ML IJ SOLN
0.5000 mg | Freq: Once | INTRAMUSCULAR | Status: AC
  Administered 2017-06-21: 0.5 mg via INTRAVENOUS
  Filled 2017-06-21: qty 1

## 2017-06-21 MED ORDER — ONDANSETRON HCL 4 MG/2ML IJ SOLN
4.0000 mg | Freq: Once | INTRAMUSCULAR | Status: AC
  Administered 2017-06-21: 4 mg via INTRAVENOUS
  Filled 2017-06-21: qty 2

## 2017-06-21 MED ORDER — IOPAMIDOL (ISOVUE-300) INJECTION 61%
100.0000 mL | Freq: Once | INTRAVENOUS | Status: AC | PRN
  Administered 2017-06-21: 100 mL via INTRAVENOUS

## 2017-06-21 MED ORDER — HYDROMORPHONE HCL 1 MG/ML IJ SOLN
1.0000 mg | Freq: Once | INTRAMUSCULAR | Status: AC
  Administered 2017-06-21: 1 mg via INTRAVENOUS
  Filled 2017-06-21: qty 1

## 2017-06-21 NOTE — ED Notes (Signed)
Bed: ZO10WA14 Expected date:  Expected time:  Means of arrival:  Comments: Transfer from [facility]

## 2017-06-21 NOTE — ED Provider Notes (Signed)
[facility] DEPT Provider Note: Teresa Dell, MD, FACEP  CSN: [id number] MRN: [id number] ARRIVAL: 06/20/17 at 2220 ROOM: WA14/WA14   CHIEF COMPLAINT  Abdominal Pain   HISTORY OF PRESENT ILLNESS  06/21/17 4:24 AM Teresa Massey is a 37 y.o. female sent from [facility] for a CT of the abdomen and pelvis as they had no ability to do that this morning.  She is a resident of the [place].  She developed generalized abdominal pain on March 31.  She was seen in the [place] and treated with hyoscyamine and Voltaren without adequate relief of the pain.  She had an ultrasound which showed "enlargement of both my ovaries and uterus" with a negative pregnancy test and bilateral ovarian cysts.  She has had abdominal distention since 4 days ago and was seen at [facility] where she had lab work done and was sent here as noted above.  She continues to have moderate generalized abdominal pain radiating to her flanks but has gotten partial relief with Dilaudid prior to transfer.  She is complaining of nausea and has not yet received an antiemetic.  Consultation with the [facility] state controlled substances database reveals the patient has received no opioid prescriptions in the past 2 years.   Past Medical History:  Diagnosis Date  . Anemia   . Genital herpes   . H/O eye surgery    bil "lazy" eyes  . Hx of bilateral breast reduction surgery 2009  . Hx of cholecystectomy 2005  . Kidney stones 2008, 2011    Past Surgical History:  Procedure Laterality Date  . BREAST REDUCTION SURGERY  2009  . CHOLECYSTECTOMY  2005  . EYE SURGERY     for a "lazy" eye (both eyes)    Family History  Problem Relation Age of Onset  . Asthma Father   . Hypertension Father   . Cancer Paternal Grandmother        pancreatic cancer    Social History   Tobacco Use  . Smoking status: Never Smoker  . Smokeless tobacco: Never Used  Substance Use Topics  . Alcohol use: No  . Drug use: No     Prior to Admission medications   Medication Sig Start Date End Date Taking? Authorizing Provider  acetaminophen-codeine (TYLENOL #3) 300-30 MG tablet Take by mouth every 4 (four) hours as needed for moderate pain.   Yes [provider]  ibuprofen (ADVIL,MOTRIN) 600 MG tablet Take 1 tablet (600 mg total) by mouth every 6 (six) hours. Patient taking differently: Take 600 mg by mouth every 6 (six) hours as needed for headache, mild pain or moderate pain.  02/14/16  Yes Arabella Merles, CNM  Prenatal Vit-Fe Fumarate-FA (PRENATAL MULTIVITAMIN) TABS tablet Take 1 tablet by mouth daily.     [provider]    Allergies Reglan [metoclopramide] and Morphine   REVIEW OF SYSTEMS  Negative except as noted here or in the History of Present Illness.   PHYSICAL EXAMINATION  Initial Vital Signs Blood pressure 112/69, pulse 87, temperature 98.6 F (37 C), resp. rate 16, currently breastfeeding.  Examination General: Well-developed, well-nourished female in no acute distress; appearance consistent with age of record HENT: normocephalic; atraumatic Eyes: pupils equal, round and reactive to light; extraocular muscles intact Neck: supple Heart: regular rate and rhythm Lungs: clear to auscultation bilaterally Abdomen: soft; nondistended; diffusely tender; bowel sounds present Extremities: No deformity; full range of motion; pulses normal Neurologic: Awake, alert and oriented; motor function intact in all  extremities and symmetric; no facial droop Skin: Warm and dry Psychiatric: Normal mood and affect   RESULTS  Summary of this visit's results, reviewed by myself:   EKG Interpretation  Date/Time:    Ventricular Rate:    PR Interval:    QRS Duration:   QT Interval:    QTC Calculation:   R Axis:     Text Interpretation:        Laboratory Studies: Results for orders placed or performed during the hospital encounter of 06/20/17 (from the past 24 hour(s))  Urinalysis,  Routine w reflex microscopic     Status: Abnormal   Collection Time: 06/20/17 11:46 PM  Result Value Ref Range   Color, Urine STRAW (A) YELLOW   APPearance CLEAR CLEAR   Specific Gravity, Urine 1.009 1.005 - 1.030   pH 7.0 5.0 - 8.0   Glucose, UA NEGATIVE NEGATIVE mg/dL   Hgb urine dipstick NEGATIVE NEGATIVE   Bilirubin Urine NEGATIVE NEGATIVE   Ketones, ur NEGATIVE NEGATIVE mg/dL   Protein, ur NEGATIVE NEGATIVE mg/dL   Nitrite NEGATIVE NEGATIVE   Leukocytes, UA NEGATIVE NEGATIVE  Pregnancy, urine POC     Status: None   Collection Time: 06/21/17  1:11 AM  Result Value Ref Range   Preg Test, Ur NEGATIVE NEGATIVE  CBC     Status: None   Collection Time: 06/21/17  1:13 AM  Result Value Ref Range   WBC 8.2 4.0 - 10.5 K/uL   RBC 4.29 3.87 - 5.11 MIL/uL   Hemoglobin 12.8 12.0 - 15.0 g/dL   HCT 21.3 08.6 - 57.8 %   MCV 89.7 78.0 - 100.0 fL   MCH 29.8 26.0 - 34.0 pg   MCHC 33.2 30.0 - 36.0 g/dL   RDW 46.9 62.9 - 52.8 %   Platelets 341 150 - 400 K/uL  Comprehensive metabolic panel     Status: Abnormal   Collection Time: 06/21/17  1:13 AM  Result Value Ref Range   Sodium 138 135 - 145 mmol/L   Potassium 4.1 3.5 - 5.1 mmol/L   Chloride 105 101 - 111 mmol/L   CO2 25 22 - 32 mmol/L   Glucose, Bld 100 (H) 65 - 99 mg/dL   BUN 17 6 - 20 mg/dL   Creatinine, Ser 4.13 0.44 - 1.00 mg/dL   Calcium 8.9 8.9 - 24.4 mg/dL   Total Protein 7.8 6.5 - 8.1 g/dL   Albumin 3.9 3.5 - 5.0 g/dL   AST 17 15 - 41 U/L   ALT 17 14 - 54 U/L   Alkaline Phosphatase 61 38 - 126 U/L   Total Bilirubin <0.1 (L) 0.3 - 1.2 mg/dL   GFR calc non Af Amer >60 >60 mL/min   GFR calc Af Amer >60 >60 mL/min   Anion gap 8 5 - 15   Imaging Studies: Ct Abdomen Pelvis W Contrast  Result Date: 06/21/2017 CLINICAL DATA:  Lower abdominal pain and abdominal distension. EXAM: CT ABDOMEN AND PELVIS WITH CONTRAST TECHNIQUE: Multidetector CT imaging of the abdomen and pelvis was performed using the standard protocol following  bolus administration of intravenous contrast. CONTRAST:  ISOVUE-300 IOPAMIDOL (ISOVUE-300) INJECTION 61% COMPARISON:  CT 06/14/2008 FINDINGS: Lower chest: Calcified nodule in the right middle lobe, likely granuloma. No pleural fluid or consolidation. Hepatobiliary: 3.6 cm peripherally enhancing a lesion in the left lobe of the liver with peripheral contrast puddling consistent with hemangioma. No suspicious liver lesion. Clips in the gallbladder fossa postcholecystectomy. No biliary dilatation. Pancreas: No ductal  dilatation or inflammation. Spleen: Normal in size without focal abnormality. Adrenals/Urinary Tract: Normal adrenal glands. There are bilateral nonobstructing renal stones. No hydronephrosis or perinephric edema. Homogeneous renal enhancement. Urinary bladder is partially distended without wall thickening. Stomach/Bowel: Stomach is within normal limits. Appendix appears normal. No evidence of bowel wall thickening, distention, or inflammatory changes. Moderate colonic stool burden. Vascular/Lymphatic: No significant vascular findings are present. Few prominent ileocolic nodes are similar to prior exam in appear benign. No enlarged abdominal or pelvic lymph nodes. Reproductive: 4.8 cm minimally complex left ovarian cyst. 3.1 cm right ovarian cyst. Uterus is unremarkable. Other: No free air, free fluid, or intra-abdominal fluid collection. Musculoskeletal: There are no acute or suspicious osseous abnormalities. IMPRESSION: 1. Bilateral ovarian cysts, 4.8 cm on the left and 3.1 cm on the right. Given size and patient age, no dedicated imaging follow-up is needed. 2. Bilateral nonobstructing nephrolithiasis. 3. Moderate stool burden in the colon, can be seen with constipation. 4. Incidental hepatic hemangioma. Electronically Signed   By: Rubye OaksMelanie  Ehinger M.D.   On: 06/21/2017 05:35    ED COURSE  Nursing notes and initial vitals signs, including pulse oximetry, reviewed.  Vitals:   06/21/17 0006  06/21/17 0142 06/21/17 0357  BP: 130/83  112/69  Pulse: 71  87  Resp: 18  16  Temp:  (!) 97.4 F (36.3 C) 98.6 F (37 C)  TempSrc:  Oral    6:08 AM Patient advised of CT findings.  The patient's abdominal distention is likely due to constipation as there is no evidence of ascites or bleeding.  She is requesting something for pain.  She was advised that narcotics are contraindicated and constipation but we will provide a few tramadol tablets for the next couple days.  She was advised to use over-the-counter MiraLAX to empty her bowels.  She was advised to return to [facility] should her symptoms worsen as she does have significant cysts.  PROCEDURES    ED DIAGNOSES     ICD-10-CM   1. Constipation in female K59.00   2. Cysts of both ovaries N83.201    N83.202        Naileah Karg, Jonny RuizJohn, MD 06/21/17 [phone]

## 2017-06-21 NOTE — MAU Provider Note (Addendum)
Patient Teresa Massey is a 37 y.o. [phone] non-pregnant female here with abdominal bloating and pain for 4 days.   History     CSN: [id number]  Arrival date and time: 06/20/17 2220   None     Chief Complaint  Patient presents with  . Abdominal Pain   Abdominal Pain  This is a new problem. The current episode started in the past 7 days. The onset quality is sudden. The pain is at a severity of 6/10. The quality of the pain is sharp, a sensation of fullness, aching and cramping. Associated symptoms include nausea. Pertinent negatives include no constipation, diarrhea, dysuria or vomiting.    Patient states that on Sunday, MArch 31st she had strong abdominal cramps. An hour later, she started her period. The rest of the day she had normal cramping. On Monday, she had strong cramps off and on throughout the week. She tried Tylenol and ibuprofen. She went to the doctor on Wednesday, April 3 and then went for an US on Thursday, April 4. Her US showed that she had one bilateral simple follicular cysts and an enlarged uterus.  She got voltaren and buscapain through the IV, which didn't work. Her Tylenol 3 didn't work either.  She can't tell if she has lost her appetite. She has not felt hungry in a long time. She ate on the plane; it really hurts to eat.  She had some lomein noodles. She denies diarrhea, constipation or other GI complaint.     OB History    Gravida  4   Para  3   Term  3   Preterm      AB  1   Living  3     SAB      TAB  1   Ectopic      Multiple  0   Live Births  3           Past Medical History:  Diagnosis Date  . Anemia   . Genital herpes   . H/O eye surgery    bil "lazy" eyes  . Hx of bilateral breast reduction surgery 2009  . Hx of cholecystectomy 2005  . Kidney stones 2008, 2011    Past Surgical History:  Procedure Laterality Date  . BREAST REDUCTION SURGERY  2009  . CHOLECYSTECTOMY  2005  . EYE SURGERY     for a "lazy" eye (both  eyes)    Family History  Problem Relation Age of Onset  . Asthma Father   . Hypertension Father   . Cancer Paternal Grandmother        pancreatic cancer    Social History   Tobacco Use  . Smoking status: Never Smoker  . Smokeless tobacco: Never Used  Substance Use Topics  . Alcohol use: No  . Drug use: No    Allergies:  Allergies  Allergen Reactions  . Reglan [Metoclopramide]   . Morphine Rash and Other (See Comments)    Reaction:  Burning      Medications Prior to Admission  Medication Sig Dispense Refill Last Dose  . ibuprofen (ADVIL,MOTRIN) 600 MG tablet Take 1 tablet (600 mg total) by mouth every 6 (six) hours. (Patient not taking: Reported on 03/01/2016) 30 tablet 0 Not Taking  . Prenatal Vit-Fe Fumarate-FA (PRENATAL MULTIVITAMIN) TABS tablet Take 1 tablet by mouth daily.    Taking    Review of Systems  HENT: Negative.   Gastrointestinal: Positive for abdominal pain and nausea. Negative for  constipation, diarrhea and vomiting.  Genitourinary: Negative for dysuria.  Neurological: Negative.   Hematological: Negative.   Psychiatric/Behavioral: Negative.    Physical Exam   Blood pressure 130/83, pulse 71, resp. rate 18, currently breastfeeding.  Physical Exam  Constitutional: She appears well-developed.  HENT:  Head: Normocephalic.  Neck: Normal range of motion.  GI: Soft.  Neurological: She is alert.  Skin: Skin is warm and dry.  Psychiatric: She has a normal mood and affect.    MAU Course  Procedures  MDM -CBC, CMP normal -Afebrile -0.5 mg of Dilaudid; 1.0 mg of Dilaudid.   -UPT negative Patient drank contrast drink at 2 am; however, CT unable to be done at [facility] due to staffing. Patient reported called to ED attending Dr. Read Drivers, who agrees to accept care of the patient.   Assessment and Plan  Transfer patient to [facility] ED for CT with contrast.    Charlesetta Garibaldi [facility] 06/21/2017, 12:36 AM

## 2017-06-21 NOTE — MAU Note (Signed)
Pt presents to MAU c/o abdominal pain,bloating and ovarian cyst. Pt states her period started on Sunday and the pain started on Monday the pain is in her pelvics and it radiates to her back. Pt went to the clinic and the US showed cyst on each ovary. Pt has then had severe bloating in her abdomen. Pt states she has a pain in the back of her leg. Pt reports nausea.

## 2017-06-23 ENCOUNTER — Ambulatory Visit (INDEPENDENT_AMBULATORY_CARE_PROVIDER_SITE_OTHER): Payer: Medicaid Other | Admitting: Obstetrics & Gynecology

## 2017-06-23 ENCOUNTER — Encounter: Payer: Self-pay | Admitting: Obstetrics & Gynecology

## 2017-06-23 VITALS — BP 118/84 | HR 81 | Ht 64.0 in | Wt 148.2 lb

## 2017-06-23 DIAGNOSIS — Z3009 Encounter for other general counseling and advice on contraception: Secondary | ICD-10-CM | POA: Diagnosis not present

## 2017-06-23 DIAGNOSIS — N946 Dysmenorrhea, unspecified: Secondary | ICD-10-CM

## 2017-06-23 MED ORDER — DICLOFENAC POTASSIUM 50 MG PO TABS: 50.0000 mg | ORAL_TABLET | Freq: Three times a day (TID) | ORAL | 0 refills | Status: DC

## 2017-06-23 NOTE — Progress Notes (Signed)
History:  37 y.o. [id number] here today for eval pf pelvic pain The pain started 3/312019. Her menses were abnormally painful. 1 hour after the pain started she began top have pain. The rest of her cycle was very strong and wrapped around her back . She started to note abd distension after her menses.s she was seen in the [place] where she lives and had a US that showed a right ov cyst. The CT in the ED showed constopation.  Her sx were reviewed although she has some mild pain at times.    Pt reports BM daily. She was surprised to see the amount of constipation. She has been suing Miralax and Dulcolax.   Pt reports pain for 2 weeks with menses. She reports bloating 1 week prior to menses. She reports 5 days cycles. Her bleeding is usually light. She does get HA prior to her cycles. Pt is on a 20mcg OCP. She has been prebnanyt x2 on OCPs.    The following portions of the patient's history were reviewed and updated as appropriate: allergies, current medications, past family history, past medical history, past social history, past surgical history and problem list.  Review of Systems:  Pertinent items are noted in HPI.    Objective:  Physical Exam Last menstrual period 06/20/2017, currently breastfeeding.  BP 118/84   Pulse 81   Ht 5\' 4"  (1.626 m)   Wt 148 lb 3.2 oz (67.2 kg)   LMP 06/12/2017 (Exact Date) Comment: negtative urine pregnancy test 06/21/17  BMI 25.44 kg/m   CONSTITUTIONAL: Well-developed, well-nourished female in no acute distress.  HENT:  Normocephalic, atraumatic EYES: Conjunctivae and EOM are normal. No scleral icterus.  NECK: Normal range of motion SKIN: Skin is warm and dry. No rash noted. Not diaphoretic.No pallor. NEUROLGIC: Alert and oriented to person, place, and time. Normal coordination.  Lungs: CTA CV: RRR Pelvic: Normal appearing external genitalia; normal appearing vaginal mucosa and cervix.  Normal discharge.  Small uterus, no other palpable masses, no uterine or  adnexal tenderness; no abd distension  Labs and Imaging Ct Abdomen Pelvis W Contrast  Result Date: 06/21/2017 CLINICAL DATA:  Lower abdominal pain and abdominal distension. EXAM: CT ABDOMEN AND PELVIS WITH CONTRAST TECHNIQUE: Multidetector CT imaging of the abdomen and pelvis was performed using the standard protocol following bolus administration of intravenous contrast. CONTRAST:  100mL ISOVUE-300 IOPAMIDOL (ISOVUE-300) INJECTION 61% COMPARISON:  CT 06/14/2008 FINDINGS: Lower chest: Calcified nodule in the right middle lobe, likely granuloma. No pleural fluid or consolidation. Hepatobiliary: 3.6 cm peripherally enhancing a lesion in the left lobe of the liver with peripheral contrast puddling consistent with hemangioma. No suspicious liver lesion. Clips in the gallbladder fossa postcholecystectomy. No biliary dilatation. Pancreas: No ductal dilatation or inflammation. Spleen: Normal in size without focal abnormality. Adrenals/Urinary Tract: Normal adrenal glands. There are bilateral nonobstructing renal stones. No hydronephrosis or perinephric edema. Homogeneous renal enhancement. Urinary bladder is partially distended without wall thickening. Stomach/Bowel: Stomach is within normal limits. Appendix appears normal. No evidence of bowel wall thickening, distention, or inflammatory changes. Moderate colonic stool burden. Vascular/Lymphatic: No significant vascular findings are present. Few prominent ileocolic nodes are similar to prior exam in appear benign. No enlarged abdominal or pelvic lymph nodes. Reproductive: 4.8 cm minimally complex left ovarian cyst. 3.1 cm right ovarian cyst. Uterus is unremarkable. Other: No free air, free fluid, or intra-abdominal fluid collection. Musculoskeletal: There are no acute or suspicious osseous abnormalities. IMPRESSION: 1. Bilateral ovarian cysts, 4.8 cm on the left and 3.1  cm on the right. Given size and patient age, no dedicated imaging follow-up is needed. 2. Bilateral  nonobstructing nephrolithiasis. 3. Moderate stool burden in the colon, can be seen with constipation. 4. Incidental hepatic hemangioma. Electronically Signed   By: Rubye Oaks M.D.   On: 06/21/2017 05:35    Assessment & Plan:  Pt with bilateral OV cysts- benign in appearance. Pt requests hyst but, at present has no indication.  D/w pt tx options of dysmenorrhea including LnIUD, Depo Provera, higher dose OCPs. Pt does not want ot preserve her fertility.   She was not interested in the changes as listed. She does want a permanent sterilization . I have informed her that a BTL will not alleviate her PMS sx or her dysmenorrhea. She expressed understanding.  Title XIX papers [place] surgery at that time  Keep Miralax daily  Patient desires surgical management with lap BTL.  The risks of surgery were discussed in detail with the patient including but not limited to: bleeding which may require transfusion or reoperation; infection which may require prolonged hospitalization or re-hospitalization and antibiotic therapy; injury to bowel, bladder, ureters and major vessels or other surrounding organs; need for additional procedures including laparotomy; thromboembolic phenomenon, incisional problems and other postoperative or anesthesia complications.  Patient was told that the likelihood that her condition and symptoms will be treated effectively with this surgical management was very high; the postoperative expectations were also discussed in detail. The patient also understands the alternative treatment options which were discussed in full. All questions were answered.  She was told that she will be contacted by our surgical scheduler regarding the time and date of her surgery; routine preoperative instructions of having nothing to eat or drink after midnight on the day prior to surgery and also coming to the hospital 1 1/2 hours prior to her  time of surgery were also emphasized.  She was told she may be called for a preoperative appointment about a week prior to surgery and will be given further preoperative instructions at that visit. She can do her preop the DOS if she doesn't arrive back until that time. Printed patient education handouts about the procedure were given to the patient to review at home.  Pt lives in the [place]  Glynnis Gavel L. Harraway-Smith, M.D., [place] [place]

## 2017-06-23 NOTE — Patient Instructions (Signed)
What You Need to Know About Female Sterilization Female sterilization is surgery to prevent pregnancy. In this surgery, the fallopian tubes are either blocked or closed off. This prevents eggs from reaching the uterus so that the eggs cannot be fertilized by sperm and you cannot get pregnant. Sterilization is permanent. It should only be done if you are sure that you do not want to be able to have children. What are the sterilization surgery options? There are several kinds of female sterilization surgeries. They include:  Laparoscopic tubal ligation. In this surgery, the fallopian tubes are tied off, sealed with heat, or blocked with a clip, ring, or clamp. A small portion of each fallopian tube may also be removed. This surgery is done through several small cuts (incisions).  Postpartum tubal ligation. This is also called a mini-laparotomy. This surgery is done right after childbirth or 1 or 2 days after childbirth. In this surgery, the fallopian tubes are tied off, sealed with heat, or blocked with a clip, ring, or clamp. A small portion of each fallopian tube may also be removed. The surgery is done through a single incision.  Hysteroscopic sterilization. In this surgery, a tiny, spring-like coil is inserted through the cervix and uterus into the fallopian tubes. The coil causes scarring, which blocks the tubes. After the surgery, contraception should be used for 3 months to allow the scar tissue to form completely.  Is sterilization safe? Generally, sterilization is safe. Complications are rare. However, there are risks. They include:  Bleeding.  Infection.  Reaction to medicine used during the procedure.  Injury to surrounding organs.  Failure of the procedure.  How effective is sterilization? Sterilization is nearly 100% effective, but it can fail. Also, the fallopian tubes can grow back together over time. If this happens, you will be able to get pregnant again. Women who have had  this procedure have a higher chance of having an ectopic pregnancy. An ectopic pregnancy is a pregnancy that happens outside of the uterus. This kind of pregnancy is unsuccessful and can lead to serious bleeding if it is not treated. What are the benefits?  It is usually effective for a lifetime.  It is usually safe.  It does not have the drawbacks of other types of birth control: That means: ? Your hormones are not affected. Because of this, your menstrual periods, sexual desire, and sexual performance will not be affected. ? There are no side effects. What are the drawbacks?  If you change your mind and decide that you want to have children, you may not be able to. Sterilization may be reversed, but a reversal is not always successful.  It does not provide protection against STDs (sexually transmitted diseases).  It increases the chance of having an ectopic pregnancy. This information is not intended to replace advice given to you by your health care provider. Make sure you discuss any questions you have with your health care provider. Document Released: 08/18/2007 Document Revised: 10/23/2015 Document Reviewed: 11/26/2014 Elsevier Interactive Patient Education  2018 [employer] Inc[employer]  

## 2017-06-24 ENCOUNTER — Ambulatory Visit: Payer: Medicaid Other | Admitting: Obstetrics & Gynecology

## 2017-07-04 ENCOUNTER — Encounter: Payer: Self-pay | Admitting: *Deleted

## 2017-07-06 ENCOUNTER — Encounter (HOSPITAL_COMMUNITY): Payer: Self-pay

## 2017-07-21 ENCOUNTER — Encounter: Payer: Self-pay | Admitting: Obstetrics & Gynecology

## 2017-08-09 ENCOUNTER — Telehealth: Payer: Self-pay | Admitting: General Practice

## 2017-08-09 NOTE — Telephone Encounter (Signed)
Patient called from [place] stating that she was diagnosis with PID and stage 4 uterine prolapse. Requesting that we notify MD of diagnosis.  Patient was told that RN will be notified and also advised patient to inform RN at pre-admission.

## 2017-08-12 NOTE — Patient Instructions (Addendum)
Your procedure is scheduled on: Thursday August 18, 2017 at 11:00 am   Enter through the Main Entrance of [facility] at: 9:30 am  Pick up the phone at the desk and dial [phone].  Call this number if you have problems the morning of surgery: [phone].  Remember: Do NOT eat food or drink any liquids : After Midnight on Wednesday June 5    Take these medicines the morning of surgery with a SIP OF WATER: Diclofenac, doxycycline, Metronidazole,   STOP ALL VITAMINS, SUPPLEMENTS, HERBAL MEDICATIONS 1 WEEK PRIOR TO SURGERY  Do NOT wear jewelry (body piercing), metal hair clips/bobby pins, make-up, or nail polish. Do NOT wear lotions, powders, or perfumes.  You may wear deoderant. Do NOT shave for 48 hours prior to surgery. Do NOT bring valuables to the hospital. Contacts, dentures, or bridgework may not be worn into surgery.  Have a responsible adult drive you home and stay with you for 24 hours after your procedure

## 2017-08-15 ENCOUNTER — Encounter (HOSPITAL_COMMUNITY)
Admission: RE | Admit: 2017-08-15 | Discharge: 2017-08-15 | Disposition: A | Discharge status: Home / Self Care | Payer: Medicaid Other | Source: Ambulatory Visit | Attending: Obstetrics & Gynecology | Admitting: Obstetrics & Gynecology

## 2017-08-15 ENCOUNTER — Encounter (HOSPITAL_COMMUNITY): Payer: Self-pay

## 2017-08-15 ENCOUNTER — Other Ambulatory Visit: Payer: Self-pay

## 2017-08-15 DIAGNOSIS — Z01812 Encounter for preprocedural laboratory examination: Secondary | ICD-10-CM | POA: Insufficient documentation

## 2017-08-15 HISTORY — DX: Headache, unspecified: R51.9

## 2017-08-15 HISTORY — DX: Personal history of other diseases of the digestive system: Z87.19

## 2017-08-15 HISTORY — DX: Tachycardia, unspecified: R00.0

## 2017-08-15 HISTORY — DX: Personal history of urinary calculi: Z87.442

## 2017-08-15 HISTORY — DX: Gestational diabetes mellitus in pregnancy, unspecified control: O24.419

## 2017-08-15 HISTORY — DX: Headache: R51

## 2017-08-15 LAB — CBC
HEMATOCRIT: 41.4 % (ref 36.0–46.0)
HEMOGLOBIN: 13.3 g/dL (ref 12.0–15.0)
MCH: 29.6 pg (ref 26.0–34.0)
MCHC: 32.1 g/dL (ref 30.0–36.0)
MCV: 92.2 fL (ref 78.0–100.0)
Platelets: 322 10*3/uL (ref 150–400)
RBC: 4.49 MIL/uL (ref 3.87–5.11)
RDW: 12.9 % (ref 11.5–15.5)
WBC: 5.5 10*3/uL (ref 4.0–10.5)

## 2017-08-18 ENCOUNTER — Encounter (HOSPITAL_COMMUNITY): Payer: Self-pay

## 2017-08-18 ENCOUNTER — Other Ambulatory Visit: Payer: Self-pay

## 2017-08-18 ENCOUNTER — Ambulatory Visit (HOSPITAL_COMMUNITY): Payer: Medicaid Other | Admitting: Anesthesiology

## 2017-08-18 ENCOUNTER — Ambulatory Visit (HOSPITAL_COMMUNITY)
Admission: AD | Admit: 2017-08-18 | Discharge: 2017-08-18 | Disposition: A | Discharge status: Home / Self Care | Payer: Medicaid Other | Source: Ambulatory Visit | Attending: Obstetrics & Gynecology | Admitting: Obstetrics & Gynecology

## 2017-08-18 ENCOUNTER — Encounter (HOSPITAL_COMMUNITY)
Admission: AD | Disposition: A | Discharge status: Home / Self Care | Payer: Self-pay | Source: Ambulatory Visit | Attending: Obstetrics & Gynecology

## 2017-08-18 DIAGNOSIS — N83202 Unspecified ovarian cyst, left side: Secondary | ICD-10-CM

## 2017-08-18 DIAGNOSIS — R102 Pelvic and perineal pain: Secondary | ICD-10-CM | POA: Diagnosis not present

## 2017-08-18 DIAGNOSIS — Z87891 Personal history of nicotine dependence: Secondary | ICD-10-CM | POA: Insufficient documentation

## 2017-08-18 DIAGNOSIS — Z79899 Other long term (current) drug therapy: Secondary | ICD-10-CM | POA: Insufficient documentation

## 2017-08-18 DIAGNOSIS — Z302 Encounter for sterilization: Secondary | ICD-10-CM

## 2017-08-18 DIAGNOSIS — N83201 Unspecified ovarian cyst, right side: Secondary | ICD-10-CM | POA: Diagnosis not present

## 2017-08-18 DIAGNOSIS — Z885 Allergy status to narcotic agent status: Secondary | ICD-10-CM | POA: Insufficient documentation

## 2017-08-18 HISTORY — PX: LAPAROSCOPY: SHX197

## 2017-08-18 HISTORY — PX: LAPAROSCOPIC TUBAL LIGATION: SHX1937

## 2017-08-18 LAB — PREGNANCY, URINE: PREG TEST UR: NEGATIVE

## 2017-08-18 SURGERY — LIGATION, FALLOPIAN TUBE, LAPAROSCOPIC
Anesthesia: General | Site: Abdomen | Laterality: Bilateral

## 2017-08-18 MED ORDER — KETOROLAC TROMETHAMINE 30 MG/ML IJ SOLN
INTRAMUSCULAR | Status: DC | PRN
  Administered 2017-08-18: 30 mg via INTRAVENOUS

## 2017-08-18 MED ORDER — LIDOCAINE HCL (CARDIAC) PF 100 MG/5ML IV SOSY
PREFILLED_SYRINGE | INTRAVENOUS | Status: DC | PRN
  Administered 2017-08-18: 60 mg via INTRAVENOUS

## 2017-08-18 MED ORDER — LIDOCAINE HCL (CARDIAC) PF 100 MG/5ML IV SOSY
PREFILLED_SYRINGE | INTRAVENOUS | Status: AC
  Filled 2017-08-18: qty 5

## 2017-08-18 MED ORDER — OXYCODONE HCL 5 MG PO TABS
5.0000 mg | ORAL_TABLET | Freq: Once | ORAL | Status: AC
  Administered 2017-08-18: 5 mg via ORAL

## 2017-08-18 MED ORDER — PROMETHAZINE HCL 25 MG/ML IJ SOLN: 6.2500 mg | INTRAMUSCULAR | Status: DC | PRN

## 2017-08-18 MED ORDER — FENTANYL CITRATE (PF) 100 MCG/2ML IJ SOLN
25.0000 ug | INTRAMUSCULAR | Status: DC | PRN
  Administered 2017-08-18 (×3): 50 ug via INTRAVENOUS

## 2017-08-18 MED ORDER — BUPIVACAINE HCL 0.5 % IJ SOLN
INTRAMUSCULAR | Status: DC | PRN
  Administered 2017-08-18: 30 mL

## 2017-08-18 MED ORDER — FENTANYL CITRATE (PF) 100 MCG/2ML IJ SOLN
INTRAMUSCULAR | Status: AC
  Administered 2017-08-18: 50 ug via INTRAVENOUS
  Filled 2017-08-18: qty 2

## 2017-08-18 MED ORDER — SODIUM CHLORIDE 0.9 % IJ SOLN
INTRAMUSCULAR | Status: AC
  Filled 2017-08-18: qty 10

## 2017-08-18 MED ORDER — KETOROLAC TROMETHAMINE 30 MG/ML IJ SOLN
INTRAMUSCULAR | Status: AC
  Filled 2017-08-18: qty 1

## 2017-08-18 MED ORDER — DEXAMETHASONE SODIUM PHOSPHATE 10 MG/ML IJ SOLN
INTRAMUSCULAR | Status: DC | PRN
  Administered 2017-08-18: 4 mg via INTRAVENOUS

## 2017-08-18 MED ORDER — MEPERIDINE HCL 25 MG/ML IJ SOLN
12.5000 mg | Freq: Once | INTRAMUSCULAR | Status: AC
  Administered 2017-08-18: 12.5 mg via INTRAVENOUS

## 2017-08-18 MED ORDER — FENTANYL CITRATE (PF) 100 MCG/2ML IJ SOLN
INTRAMUSCULAR | Status: DC | PRN
  Administered 2017-08-18 (×2): 100 ug via INTRAVENOUS

## 2017-08-18 MED ORDER — BUPIVACAINE HCL (PF) 0.5 % IJ SOLN
INTRAMUSCULAR | Status: AC
  Filled 2017-08-18: qty 30

## 2017-08-18 MED ORDER — ONDANSETRON HCL 4 MG/2ML IJ SOLN
INTRAMUSCULAR | Status: DC | PRN
  Administered 2017-08-18: 4 mg via INTRAVENOUS

## 2017-08-18 MED ORDER — MEPERIDINE HCL 25 MG/ML IJ SOLN
INTRAMUSCULAR | Status: AC
  Filled 2017-08-18: qty 1

## 2017-08-18 MED ORDER — ROCURONIUM BROMIDE 100 MG/10ML IV SOLN
INTRAVENOUS | Status: AC
  Filled 2017-08-18: qty 1

## 2017-08-18 MED ORDER — PROPOFOL 10 MG/ML IV BOLUS
INTRAVENOUS | Status: AC
  Filled 2017-08-18: qty 20

## 2017-08-18 MED ORDER — LACTATED RINGERS IV SOLN
INTRAVENOUS | Status: DC
  Administered 2017-08-18: 12:00:00 via INTRAVENOUS
  Administered 2017-08-18: 125 mL/h via INTRAVENOUS

## 2017-08-18 MED ORDER — DEXAMETHASONE SODIUM PHOSPHATE 4 MG/ML IJ SOLN
INTRAMUSCULAR | Status: AC
  Filled 2017-08-18: qty 1

## 2017-08-18 MED ORDER — MIDAZOLAM HCL 2 MG/2ML IJ SOLN
INTRAMUSCULAR | Status: AC
  Filled 2017-08-18: qty 2

## 2017-08-18 MED ORDER — SUGAMMADEX SODIUM 200 MG/2ML IV SOLN
INTRAVENOUS | Status: DC | PRN
  Administered 2017-08-18: 137.8 mg via INTRAVENOUS

## 2017-08-18 MED ORDER — ONDANSETRON HCL 4 MG/2ML IJ SOLN
INTRAMUSCULAR | Status: AC
  Filled 2017-08-18: qty 2

## 2017-08-18 MED ORDER — SCOPOLAMINE 1 MG/3DAYS TD PT72
1.0000 | MEDICATED_PATCH | Freq: Once | TRANSDERMAL | Status: DC
  Administered 2017-08-18: 1.5 mg via TRANSDERMAL

## 2017-08-18 MED ORDER — TRAMADOL HCL 50 MG PO TABS: 50.0000 mg | ORAL_TABLET | Freq: Four times a day (QID) | ORAL | 0 refills | Status: DC | PRN

## 2017-08-18 MED ORDER — ACETAMINOPHEN 10 MG/ML IV SOLN
1000.0000 mg | Freq: Once | INTRAVENOUS | Status: AC
  Administered 2017-08-18: 1000 mg via INTRAVENOUS

## 2017-08-18 MED ORDER — SUGAMMADEX SODIUM 200 MG/2ML IV SOLN
INTRAVENOUS | Status: AC
  Filled 2017-08-18: qty 2

## 2017-08-18 MED ORDER — ROCURONIUM BROMIDE 100 MG/10ML IV SOLN
INTRAVENOUS | Status: DC | PRN
  Administered 2017-08-18: 40 mg via INTRAVENOUS

## 2017-08-18 MED ORDER — MIDAZOLAM HCL 2 MG/2ML IJ SOLN
INTRAMUSCULAR | Status: DC | PRN
  Administered 2017-08-18: 2 mg via INTRAVENOUS

## 2017-08-18 MED ORDER — PROPOFOL 10 MG/ML IV BOLUS
INTRAVENOUS | Status: DC | PRN
  Administered 2017-08-18: 150 mg via INTRAVENOUS

## 2017-08-18 MED ORDER — DICLOFENAC POTASSIUM 50 MG PO TABS: 50.0000 mg | ORAL_TABLET | Freq: Three times a day (TID) | ORAL | 0 refills | Status: DC

## 2017-08-18 MED ORDER — OXYCODONE HCL 5 MG PO TABS
ORAL_TABLET | ORAL | Status: AC
  Administered 2017-08-18: 5 mg via ORAL
  Filled 2017-08-18: qty 1

## 2017-08-18 MED ORDER — ACETAMINOPHEN 10 MG/ML IV SOLN
INTRAVENOUS | Status: AC
  Administered 2017-08-18: 1000 mg via INTRAVENOUS
  Filled 2017-08-18: qty 100

## 2017-08-18 MED ORDER — FENTANYL CITRATE (PF) 250 MCG/5ML IJ SOLN
INTRAMUSCULAR | Status: AC
  Filled 2017-08-18: qty 5

## 2017-08-18 MED ORDER — SCOPOLAMINE 1 MG/3DAYS TD PT72
MEDICATED_PATCH | TRANSDERMAL | Status: AC
  Filled 2017-08-18: qty 1

## 2017-08-18 MED ORDER — LACTATED RINGERS IV SOLN: INTRAVENOUS | Status: DC

## 2017-08-18 SURGICAL SUPPLY — 33 items
CABLE HIGH FREQUENCY MONO STRZ (ELECTRODE) IMPLANT
CATH ROBINSON RED A/P 16FR (CATHETERS) IMPLANT
CLIP FILSHIE TUBAL LIGA STRL (Clip) ×4 IMPLANT
DRSG OPSITE POSTOP 3X4 (GAUZE/BANDAGES/DRESSINGS) ×4 IMPLANT
DURAPREP 26ML APPLICATOR (WOUND CARE) ×4 IMPLANT
GLOVE BIO SURGEON STRL SZ7 (GLOVE) ×4 IMPLANT
GLOVE BIOGEL PI IND STRL 7.0 (GLOVE) ×5 IMPLANT
GLOVE BIOGEL PI INDICATOR 7.0 (GLOVE) ×5
GOWN STRL REUS W/TWL LRG LVL3 (GOWN DISPOSABLE) ×6 IMPLANT
GOWN STRL REUS W/TWL XL LVL3 (GOWN DISPOSABLE) ×4 IMPLANT
MANIPULATOR UTERINE 4.5 ZUMI (MISCELLANEOUS) ×4 IMPLANT
NEEDLE INSUFFLATION 120MM (ENDOMECHANICALS) ×4 IMPLANT
NS IRRIG 1000ML POUR BTL (IV SOLUTION) ×2 IMPLANT
PACK LAPAROSCOPY BASIN (CUSTOM PROCEDURE TRAY) ×4 IMPLANT
PACK TRENDGUARD 450 HYBRID PRO (MISCELLANEOUS) ×2 IMPLANT
POUCH SPECIMEN RETRIEVAL 10MM (ENDOMECHANICALS) IMPLANT
PROTECTOR NERVE ULNAR (MISCELLANEOUS) ×8 IMPLANT
SET IRRIG TUBING LAPAROSCOPIC (IRRIGATION / IRRIGATOR) IMPLANT
SHEARS HARMONIC ACE PLUS 36CM (ENDOMECHANICALS) ×2 IMPLANT
SLEEVE XCEL OPT CAN 5 100 (ENDOMECHANICALS) ×2 IMPLANT
SUT VIC AB 3-0 PS2 18 (SUTURE) ×2 IMPLANT
SUT VIC AB 3-0 X1 27 (SUTURE) ×2 IMPLANT
SUT VICRYL 0 UR6 27IN ABS (SUTURE) ×6 IMPLANT
SUT VICRYL 4-0 PS2 18IN ABS (SUTURE) ×2 IMPLANT
SYR 10ML LL (SYRINGE) ×4 IMPLANT
SYSTEM CARTER THOMASON II (TROCAR) ×2 IMPLANT
TOWEL OR 17X24 6PK STRL BLUE (TOWEL DISPOSABLE) ×8 IMPLANT
TRAY FOLEY W/BAG SLVR 14FR (SET/KITS/TRAYS/PACK) ×2 IMPLANT
TRENDGUARD 450 HYBRID PRO PACK (MISCELLANEOUS) ×4
TROCAR OPTI TIP 5M 100M (ENDOMECHANICALS) IMPLANT
TROCAR XCEL DIL TIP R 11M (ENDOMECHANICALS) ×4 IMPLANT
TROCAR XCEL NON-BLD 5MMX100MML (ENDOMECHANICALS) IMPLANT
WARMER LAPAROSCOPE (MISCELLANEOUS) ×2 IMPLANT

## 2017-08-18 NOTE — Anesthesia Procedure Notes (Signed)
Procedure Name: Intubation Date/Time: 08/18/2017 11:03 AM Performed by: Hewitt Blade, CRNA Pre-anesthesia Checklist: Patient identified, Emergency Drugs available, Suction available and Patient being monitored Patient Re-evaluated:Patient Re-evaluated prior to induction Oxygen Delivery Method: Circle system utilized Preoxygenation: Pre-oxygenation with 100% oxygen Induction Type: IV induction Ventilation: Mask ventilation without difficulty Laryngoscope Size: Mac and 3 Grade View: Grade I Tube type: Oral Tube size: 7.0 mm Number of attempts: 1 Airway Equipment and Method: Stylet Placement Confirmation: ETT inserted through vocal cords under direct vision,  positive ETCO2 and breath sounds checked- equal and bilateral Secured at: 21 cm Tube secured with: Tape Dental Injury: Teeth and Oropharynx as per pre-operative assessment

## 2017-08-18 NOTE — Op Note (Signed)
08/18/2017  12:22 PM  PATIENT:  Teresa Massey  37 y.o. female  PRE-OPERATIVE DIAGNOSIS:  Undesired Fertility; pelvic pain; ovarian cysts  POST-OPERATIVE DIAGNOSIS:  undesired fertility; pelvic pain; ovarian cysts   PROCEDURE:  Procedure(s) with comments: LAPAROSCOPIC TUBAL LIGATION WITH FILSHIE CLIPS (Bilateral) LAPAROSCOPY DIAGNOSTIC WITH DRAINAGE OF BILATERAL OVARIAN CYSTS (Bilateral) - removal of ovarian cyst  SURGEON:  Surgeon(s) and Role:    * Willodean Rosenthal, MD - Primary  ASSISTANTS: Scheryl Darter, MD     ANESTHESIA:   local and general  EBL:  5 mL   BLOOD ADMINISTERED:none  DRAINS: none   LOCAL MEDICATIONS USED:  MARCAINE     SPECIMEN:  No Specimen  DISPOSITION OF SPECIMEN:  N/A  COUNTS:  YES  TOURNIQUET:  * No tourniquets in log *  DICTATION: .Note written in EPIC  PLAN OF CARE: Discharge to home after PACU  PATIENT DISPOSITION:  PACU - hemodynamically stable.   Delay start of Pharmacological VTE agent (>24hrs) due to surgical blood loss or risk of bleeding: not applicable  Complications: none immediate  INDICATIONS: 37 y.o. [id number]  with undesired fertility, desires permanent sterilization and bilateral ovarian cysts noted on an [place] that she brought with her from the [place]. She reported pelvic pain. Other reversible forms of contraception were discussed with patient; she declines all other modalities.  Risks of procedure discussed with patient including permanence of method, bleeding, infection, injury to surrounding organs and need for additional procedures including laparotomy, risk of regret.  Failure risk of 0.5-1% with increased risk of ectopic gestation if pregnancy occurs was also discussed with patient.      FINDINGS:  Normal uterus and tubes. Bilateral simple ovarian cysts.   TECHNIQUE:  The patient was taken to the operating room where general anesthesia was obtained without difficulty.  She was then placed in the dorsal lithotomy  position and prepared and draped in sterile fashion.  After an adequate timeout was performed, a bivalved speculum was then placed in the patient's vagina, and the anterior lip of cervix grasped with the single-tooth tenaculum.  The uterine manipulator was then advanced into the uterus.  The speculum was removed from the vagina.  Attention was then turned to the patient's abdomen where a 5-mm skin incision was made in the umbilical fold.  The Veress needle was carefully introduced into the peritoneal cavity through the abdominal wall.  Intraperitoneal placement was confirmed by drop in intraabdominal pressure with insufflation of carbon dioxide gas.  Adequate pneumoperitoneum was obtained, and the 5-mm trocar and sleeve were then advanced without difficulty into the abdomen where intraabdominal placement was confirmed by the operative laparoscope. A survey of the patient's pelvis and abdomen revealed the above anatomy.  At this point, a 5 mm ports was placed in the right lower quadrant and a 10mm port was placed on the left side. The ovaries were identified and the Harmonic scalpel was used to drain the cysts on each side.  The fallopian tubes were observed and found to be normal in appearance. The Filshie clip applicator was placed through the operative port, and a Filshie clip was placed on the right fallopian tube ,about 2 cm from the cornual attachment, with care given to incorporate the underlying mesosalpinx.  A similar process was carried out on the contralateral side allowing for bilateral tubal sterilization.   Good hemostasis was noted overall. The instruments were then removed from the patient's abdomen and the fascial incision was repaired with 0 Vicryl, and the  skin was closed with Dermabond.  The uterine manipulator and the tenaculum were removed from the vagina without complications. The patient tolerated the procedure well.  Sponge, lap, and needle counts were correct times two.  The patient was  then taken to the recovery room awake, extubated and in stable condition.  Nafisah Runions L. Harraway-Smith, M.D., [facility]

## 2017-08-18 NOTE — Discharge Instructions (Signed)
Laparoscopic Tubal Ligation, Care After Refer to this sheet in the next few weeks. These instructions provide you with information about caring for yourself after your procedure. Your health care provider may also give you more specific instructions. Your treatment has been planned according to current medical practices, but problems sometimes occur. Call your health care provider if you have any problems or questions after your procedure. What can I expect after the procedure? After the procedure, it is common to have:  A sore throat.  Discomfort in your shoulder.  Mild discomfort or cramping in your abdomen.  Gas pains.  Pain or soreness in the area where the surgical cut (incision) was made.  A bloated feeling.  Tiredness.  Nausea.  Vomiting.  Follow these instructions at home: Medicines  Take over-the-counter and prescription medicines only as told by your health care provider.  Do not take aspirin because it can cause bleeding.  Do not drive or operate heavy machinery while taking prescription pain medicine. Activity Get plenty of rest for the remainder of the day. A responsible individual must stay with you for 24 hours following the procedure.  For the next 24 hours, DO NOT: -Drive a car -Advertising copywriter -Drink alcoholic beverages -Take any medication unless instructed by your physician -Make any legal decisions or sign important papers.  Incision care  Follow instructions from your health care provider about how to take care of your incision. Make sure you: ? Wash your hands with soap and water before you change your bandage (dressing). If soap and water are not available, use hand sanitizer. ? Change your dressing as told by your health care provider. ? Leave stitches (sutures) in place. They may need to stay in place for 2 weeks or longer.  Check your incision area every day for signs of infection. Check for: ? More redness, swelling, or pain. ? More fluid  or blood. ? Warmth. ? Pus or a bad smell. Other Instructions  Do not take baths, swim, or use a hot tub until your health care provider approves. You may take showers.  Keep all follow-up visits as told by your health care provider. This is important.  Have someone help you with your daily household tasks for the first few days. Contact a health care provider if:  You have more redness, swelling, or pain around your incision.  Your incision feels warm to the touch.  You have pus or a bad smell coming from your incision.  The edges of your incision break open after the sutures have been removed.  Your pain does not improve after 2-3 days.  You have a rash.  You repeatedly become dizzy or light-headed.  Your pain medicine is not helping.  You are constipated. Get help right away if:  You have a fever.  You faint.  You have increasing pain in your abdomen.  You have severe pain in one or both of your shoulders.  You have fluid or blood coming from your sutures or from your vagina.  You have shortness of breath or difficulty breathing.  You have chest pain or leg pain.  You have ongoing nausea, vomiting, or diarrhea. This information is not intended to replace advice given to you by your health care provider. Make sure you discuss any questions you have with your health care provider. Document Released: 09/18/2004 Document Revised: 08/04/2015 Document Reviewed: 02/09/2015 Elsevier Interactive Patient Education  2018 [employer] DISCHARGE INSTRUCTIONS: Laparoscopy  Wound care:  Do not get the incision  wet for the first 24 hours. The incision should be kept clean and dry.  The Band-Aids or dressings may be removed the day after surgery.  Should the incision become sore, red, and swollen after the first week, check with your doctor.  Personal hygiene:  Shower the day after your procedure.  Activity and limitations:  Do NOT drive or operate any equipment  today.  Do NOT lift anything more than 15 pounds for 2-3 weeks after surgery.  Do NOT rest in bed all day.  Walking is encouraged. Walk each day, starting slowly with 5-minute walks 3 or 4 times a day. Slowly increase the length of your walks.  Walk up and down stairs slowly.  Do NOT do strenuous activities, such as golfing, playing tennis, bowling, running, biking, weight lifting, gardening, mowing, or vacuuming for 2-4 weeks. Ask your doctor when it is okay to start.  Return to work: This is dependent on the type of work you do. For the most part you can return to a desk job within a week of surgery. If you are more active at work, please discuss this with your doctor.  What to expect after your surgery: You may have a slight burning sensation when you urinate on the first day. You may have a very small amount of blood in the urine. Expect to have a small amount of vaginal discharge/light bleeding for 1-2 weeks. It is not unusual to have abdominal soreness and bruising for up to 2 weeks. You may be tired and need more rest for about 1 week. You may experience shoulder pain for 24-72 hours. Lying flat in bed may relieve it.  Call your doctor for any of the following:  Develop a fever of 100.4 or greater  Inability to urinate 6 hours after discharge from hospital  Severe pain not relieved by pain medications  Persistent of heavy bleeding at incision site  Redness or swelling around incision site after a week  Increasing nausea or vomiting  Special Instructions/Symptoms: Your throat may feel dry or sore from the anesthesia or the breathing tube placed in your throat during surgery. If this causes discomfort, gargle with warm salt water. The discomfort should disappear within 24 hours.  If you had a scopolamine patch placed behind your ear for the management of post- operative nausea and/or vomiting:  1. The medication in the patch is effective for 72 hours, after which it should be  removed.  Wrap patch in a tissue and discard in the trash. Wash hands thoroughly with soap and water. 2. You may remove the patch earlier than 72 hours if you experience unpleasant side effects which may include dry mouth, dizziness or visual disturbances. 3. Avoid touching the patch. Wash your hands with soap and water after contact with the patch.

## 2017-08-18 NOTE — Transfer of Care (Signed)
Immediate Anesthesia Transfer of Care Note  Patient: Teresa Massey  Procedure(s) Performed: LAPAROSCOPIC TUBAL LIGATION WITH FILSHIE CLIPS (Bilateral Abdomen) LAPAROSCOPY DIAGNOSTIC WITH DRAINAGE OF BILATERAL OVARIAN CYSTS (Bilateral Abdomen)  Patient Location: PACU  Anesthesia Type:General  Level of Consciousness: awake, alert  and oriented  Airway & Oxygen Therapy: Patient Spontanous Breathing and Patient connected to nasal cannula oxygen  Post-op Assessment: Report given to RN, Post -op Vital signs reviewed and stable and Patient moving all extremities  Post vital signs: Reviewed and stable  Last Vitals:  Vitals Value Taken Time  BP 123/72 08/18/2017 12:01 PM  Temp    Pulse 81 08/18/2017 12:04 PM  Resp 8 08/18/2017 12:04 PM  SpO2 100 % 08/18/2017 12:04 PM  Vitals shown include unvalidated device data.  Last Pain:  Vitals:   08/18/17 1001  TempSrc: Oral      Patients Stated Pain Goal: 3 (08/18/17 1001)  Complications: No apparent anesthesia complications

## 2017-08-18 NOTE — Anesthesia Postprocedure Evaluation (Signed)
Anesthesia Post Note  Patient: Teresa Massey  Procedure(s) Performed: LAPAROSCOPIC TUBAL LIGATION WITH FILSHIE CLIPS (Bilateral Abdomen) LAPAROSCOPY DIAGNOSTIC WITH DRAINAGE OF BILATERAL OVARIAN CYSTS (Bilateral Abdomen)     Patient location during evaluation: PACU Anesthesia Type: General Level of consciousness: awake and alert Pain management: pain level controlled Vital Signs Assessment: post-procedure vital signs reviewed and stable Respiratory status: spontaneous breathing, nonlabored ventilation, respiratory function stable and patient connected to nasal cannula oxygen Cardiovascular status: blood pressure returned to baseline and stable Postop Assessment: no apparent nausea or vomiting Anesthetic complications: no    Last Vitals:  Vitals:   08/18/17 1330 08/18/17 1433  BP: 112/75 121/82  Pulse: 75 79  Resp: 19 18  Temp:  36.6 C  SpO2: 97% 99%    Last Pain:  Vitals:   08/18/17 1330  TempSrc:   PainSc: 7    Pain Goal: Patients Stated Pain Goal: 3 (08/18/17 1330)               [name], [name]

## 2017-08-18 NOTE — Anesthesia Preprocedure Evaluation (Signed)
Anesthesia Evaluation  Patient identified by MRN, date of birth, ID band Patient awake    Reviewed: Allergy & Precautions, NPO status , Patient's Chart, lab work & pertinent test results  Airway Mallampati: II  TM Distance: >3 FB Neck ROM: Full    Dental  (+) Dental Advisory Given   Pulmonary former smoker,    breath sounds clear to auscultation       Cardiovascular negative cardio ROS   Rhythm:Regular Rate:Normal     Neuro/Psych negative neurological ROS     GI/Hepatic negative GI ROS, Neg liver ROS,   Endo/Other  negative endocrine ROS  Renal/GU negative Renal ROS     Musculoskeletal   Abdominal   Peds  Hematology negative hematology ROS (+)   Anesthesia Other Findings   Reproductive/Obstetrics                             Anesthesia Physical Anesthesia Plan  ASA: I  Anesthesia Plan: General   Post-op Pain Management:    Induction: Intravenous  PONV Risk Score and Plan: 4 or greater and Midazolam, Dexamethasone, Ondansetron, Treatment may vary due to age or medical condition and Scopolamine patch - Pre-op  Airway Management Planned: Oral ETT  Additional Equipment:   Intra-op Plan:   Post-operative Plan: Extubation in OR  Informed Consent: I have reviewed the patients History and Physical, chart, labs and discussed the procedure including the risks, benefits and alternatives for the proposed anesthesia with the patient or authorized representative who has indicated his/her understanding and acceptance.   Dental advisory given  Plan Discussed with: CRNA  Anesthesia Plan Comments:         Anesthesia Quick Evaluation

## 2017-08-18 NOTE — H&P (Signed)
Preoperative History and Physical  Teresa Massey is a 37 y.o. [phone] here for surgical management of undesired fertility; pelvic pain and ovarian cyst   Proposed surgery: laparoscopy with ovarian cystectomy and tubal ligation using Filshie clips  Past Medical History:  Diagnosis Date  . Anemia   . Genital herpes   . Gestational diabetes 2017  . H/O eye surgery    bil "lazy" eyes  . Headache    migraines  . History of hiatal hernia   . History of kidney stones   . Hx of bilateral breast reduction surgery 2009  . Hx of cholecystectomy 2005  . Kidney stones 2008, 2011  . Tachycardia    Past Surgical History:  Procedure Laterality Date  . BREAST REDUCTION SURGERY  2009  . CHOLECYSTECTOMY  2005  . EYE SURGERY     for a "lazy" eye (both eyes)  . WISDOM TOOTH EXTRACTION  1995   OB History    Gravida  4   Para  3   Term  3   Preterm      AB  1   Living  3     SAB      TAB  1   Ectopic      Multiple  0   Live Births  3          Patient denies any cervical dysplasia or STIs. Medications Prior to Admission  Medication Sig Dispense Refill Last Dose  . acetaminophen-codeine (TYLENOL #3) 300-30 MG tablet Take 1 tablet by mouth 4 (four) times daily as needed for moderate pain.   Past Week at Unknown time  . Cholecalciferol (VITAMIN D3) 5000 units CAPS Take 5,000 Units by mouth daily.   Past Week at Unknown time  . diclofenac (CATAFLAM) 50 MG tablet Take 1 tablet (50 mg total) by mouth 3 (three) times daily. (Patient taking differently: Take 50 mg by mouth 2 (two) times daily. ) 60 tablet 0 08/18/2017 at 0700  . doxycycline (VIBRAMYCIN) 100 MG capsule Take 100 mg by mouth 2 (two) times daily.   08/18/2017 at 0700  . ibuprofen (ADVIL,MOTRIN) 200 MG tablet Take 600 mg by mouth every 6 (six) hours as needed for moderate pain.   Past Week at Unknown time  . Melatonin 5 MG TABS Take 5 mg by mouth at bedtime as needed (sleep).   08/17/2017 at 2130  . metroNIDAZOLE (FLAGYL)  500 MG tablet Take 500 mg by mouth 2 (two) times daily.   08/18/2017 at 0700  . traMADol (ULTRAM) 50 MG tablet Take 1 tablet (50 mg total) by mouth every 6 (six) hours as needed (for pain). 12 tablet 0 Past Week at Unknown time    Allergies  Allergen Reactions  . Reglan [Metoclopramide] Other (See Comments)    Felt like I was going to "jump out of my skin"  . Dilaudid [Hydromorphone Hcl] Other (See Comments)    Causes migraines  . Morphine Rash and Other (See Comments)    Burning     Social History:   reports that she quit smoking about 14 years ago. Her smoking use included cigarettes. She has a 1.00 pack-year smoking history. She has never used smokeless tobacco. She reports that she does not drink alcohol or use drugs. Family History  Problem Relation Age of Onset  . Asthma Father   . Hypertension Father   . Cancer Paternal Grandmother        pancreatic cancer    Review of Systems:  Noncontributory  PHYSICAL EXAM: Blood pressure 114/90, pulse (!) 101, temperature 98.5 F (36.9 C), temperature source Oral, resp. rate 16, last menstrual period 08/07/2017, SpO2 99 %, currently breastfeeding. General appearance - alert, well appearing, and in no distress Chest - clear to auscultation, no wheezes, rales or rhonchi, symmetric air entry Heart - normal rate and regular rhythm Abdomen - soft, nontender, nondistended, no masses or organomegaly Pelvic - examination not indicated Extremities - peripheral pulses normal, no pedal edema, no clubbing or cyanosis  Labs: Results for orders placed or performed during the hospital encounter of 08/18/17 (from the past 336 hour(s))  Pregnancy, urine   Collection Time: 08/18/17  9:30 AM  Result Value Ref Range   Preg Test, Ur NEGATIVE NEGATIVE  Results for orders placed or performed during the hospital encounter of 08/15/17 (from the past 336 hour(s))  CBC   Collection Time: 08/15/17 10:30 AM  Result Value Ref Range   WBC 5.5 4.0 - 10.5 K/uL    RBC 4.49 3.87 - 5.11 MIL/uL   Hemoglobin 13.3 12.0 - 15.0 g/dL   HCT 16.141.4 09.636.0 - 04.546.0 %   MCV 92.2 78.0 - 100.0 fL   MCH 29.6 26.0 - 34.0 pg   MCHC 32.1 30.0 - 36.0 g/dL   RDW 40.912.9 81.111.5 - 91.415.5 %   Platelets 322 150 - 400 K/uL    Imaging Studies: No results found.  Assessment: There are no active problems to display for this patient.   Plan: Patient will undergo surgical management with laparoscopy with ovarian cystectomy and tubal ligation using Filshie clips.   The risks of surgery were discussed in detail with the patient including but not limited to: bleeding which may require transfusion or reoperation; infection which may require antibiotics; injury to surrounding organs which may involve bowel, bladder, ureters ; need for additional procedures including laparoscopy or laparotomy; thromboembolic phenomenon, surgical site problems and other postoperative/anesthesia complications. Likelihood of success in alleviating the patient's condition was discussed. Routine postoperative instructions will be reviewed with the patient and her family in detail after surgery.  The patient concurred with the proposed plan, giving informed written consent for the surgery.  Patient has been NPO since last night she will remain NPO for procedure.  Anesthesia and OR aware.  Preoperative prophylactic antibiotics and SCDs ordered on call to the OR.  To OR when ready.  Jaylee Lantry L. Harraway-Smith, M.D., [facility] 08/18/2017 10:40 AM

## 2017-08-18 NOTE — Brief Op Note (Signed)
08/18/2017  12:22 PM  PATIENT:  Tomma LightningMaurine B Demers  37 y.o. female  PRE-OPERATIVE DIAGNOSIS:  Undesired Fertility; pelvic pain; ovarian cysts  POST-OPERATIVE DIAGNOSIS:  undesired fertility; pelvic pain; ovarian cysts   PROCEDURE:  Procedure(s) with comments: LAPAROSCOPIC TUBAL LIGATION WITH FILSHIE CLIPS (Bilateral) LAPAROSCOPY DIAGNOSTIC WITH DRAINAGE OF BILATERAL OVARIAN CYSTS (Bilateral) - removal of ovarian cyst  SURGEON:  Surgeon(s) and Role:    * Willodean RosenthalHarraway-Smith, Lillee Mooneyhan, MD - Primary  ASSISTANTS: Scheryl DarterJames Arnold, MD     ANESTHESIA:   local and general  EBL:  5 mL   BLOOD ADMINISTERED:none  DRAINS: none   LOCAL MEDICATIONS USED:  MARCAINE     SPECIMEN:  No Specimen  DISPOSITION OF SPECIMEN:  N/A  COUNTS:  YES  TOURNIQUET:  * No tourniquets in log *  DICTATION: .Note written in EPIC  PLAN OF CARE: Discharge to home after PACU  PATIENT DISPOSITION:  PACU - hemodynamically stable.   Delay start of Pharmacological VTE agent (>24hrs) due to surgical blood loss or risk of bleeding: not applicable  Complications: none immediate  Chryl Holten L. Harraway-Smith, M.D., [facility]

## 2017-08-19 ENCOUNTER — Encounter (HOSPITAL_COMMUNITY): Payer: Self-pay | Admitting: Obstetrics & Gynecology

## 2017-08-25 ENCOUNTER — Ambulatory Visit (INDEPENDENT_AMBULATORY_CARE_PROVIDER_SITE_OTHER): Payer: Medicaid Other | Admitting: Obstetrics & Gynecology

## 2017-08-25 ENCOUNTER — Encounter: Payer: Self-pay | Admitting: Obstetrics & Gynecology

## 2017-08-25 VITALS — BP 124/77 | HR 92 | Ht 64.0 in | Wt 153.0 lb

## 2017-08-25 DIAGNOSIS — Z9889 Other specified postprocedural states: Secondary | ICD-10-CM

## 2017-08-25 NOTE — Patient Instructions (Signed)
Laparoscopic Tubal Ligation, Care After °Refer to this sheet in the next few weeks. These instructions provide you with information about caring for yourself after your procedure. Your health care provider may also give you more specific instructions. Your treatment has been planned according to current medical practices, but problems sometimes occur. Call your health care provider if you have any problems or questions after your procedure. °What can I expect after the procedure? °After the procedure, it is common to have: °· A sore throat. °· Discomfort in your shoulder. °· Mild discomfort or cramping in your abdomen. °· Gas pains. °· Pain or soreness in the area where the surgical cut (incision) was made. °· A bloated feeling. °· Tiredness. °· Nausea. °· Vomiting. ° °Follow these instructions at home: °Medicines °· Take over-the-counter and prescription medicines only as told by your health care provider. °· Do not take aspirin because it can cause bleeding. °· Do not drive or operate heavy machinery while taking prescription pain medicine. °Activity °· Rest for the rest of the day. °· Return to your normal activities as told by your health care provider. Ask your health care provider what activities are safe for you. °Incision care ° °· Follow instructions from your health care provider about how to take care of your incision. Make sure you: °? Wash your hands with soap and water before you change your bandage (dressing). If soap and water are not available, use hand sanitizer. °? Change your dressing as told by your health care provider. °? Leave stitches (sutures) in place. They may need to stay in place for 2 weeks or longer. °· Check your incision area every day for signs of infection. Check for: °? More redness, swelling, or pain. °? More fluid or blood. °? Warmth. °? Pus or a bad smell. °Other Instructions °· Do not take baths, swim, or use a hot tub until your health care provider approves. You may take  showers. °· Keep all follow-up visits as told by your health care provider. This is important. °· Have someone help you with your daily household tasks for the first few days. °Contact a health care provider if: °· You have more redness, swelling, or pain around your incision. °· Your incision feels warm to the touch. °· You have pus or a bad smell coming from your incision. °· The edges of your incision break open after the sutures have been removed. °· Your pain does not improve after 2-3 days. °· You have a rash. °· You repeatedly become dizzy or light-headed. °· Your pain medicine is not helping. °· You are constipated. °Get help right away if: °· You have a fever. °· You faint. °· You have increasing pain in your abdomen. °· You have severe pain in one or both of your shoulders. °· You have fluid or blood coming from your sutures or from your vagina. °· You have shortness of breath or difficulty breathing. °· You have chest pain or leg pain. °· You have ongoing nausea, vomiting, or diarrhea. °This information is not intended to replace advice given to you by your health care provider. Make sure you discuss any questions you have with your health care provider. °Document Released: 09/18/2004 Document Revised: 08/04/2015 Document Reviewed: 02/09/2015 °Elsevier Interactive Patient Education © 2018 [employer] ° °

## 2017-08-25 NOTE — Progress Notes (Signed)
Subjective:     Teresa Massey is a 37 y.o. female who presents to the clinic 1 weeks status post laparoscopy and BTL and drainage of ovarian cyst for requested sterilization. Eating a regular diet without difficulty. Bowel movements are normal. Pain is controlled with current analgesics. Medications being used: narcotic analgesics including Percocet.  The following portions of the patient's history were reviewed and updated as appropriate: allergies, current medications, past family history, past medical history, past social history, past surgical history and problem list.  Review of Systems Pertinent items are noted in HPI.    Objective:    BP 124/77   Pulse 92   Ht 5\' 4"  (1.626 m)   Wt 153 lb (69.4 kg)   LMP 08/07/2017 (Approximate)   BMI 26.26 kg/m  General:  alert, cooperative and no distress  Abdomen: soft, bowel sounds active, non-tender  Incision:   healing well, no drainage, no hernia, no seroma, incision well approximated, bruising noted near incisions     Assessment:    Doing well postoperatively. Operative findings again reviewed. Pathology report discussed.    Plan:    1. Continue any current medications. 2. Wound care discussed. 3. Activity restrictions: no driving and for one week 4. Anticipated return to work: 4 weeks. 5. Follow up:  as needed, [place]  Adam Phenixrnold, James G, MD 08/25/2017

## 2017-08-31 ENCOUNTER — Telehealth: Payer: Self-pay | Admitting: *Deleted

## 2017-08-31 NOTE — Telephone Encounter (Signed)
Pt left message stating that she had surgery on 6/6. Beginning a couple days ago she noticed swelling (did not say where) and also has heavy bleeding with clots. In addition, she is having pain on her Lt side. She wants to know if this is normal considering the surgery was on 6/6. I returned pt's call and left a message stating that I had some questions regarding her sx in order to provide advice. We will try to reach her later today.

## 2017-08-31 NOTE — Telephone Encounter (Signed)
Patient called and left message stating she is returning our call, please call back.

## 2017-09-08 ENCOUNTER — Encounter: Payer: Self-pay | Admitting: General Practice

## 2017-09-23 ENCOUNTER — Encounter: Payer: Self-pay | Admitting: *Deleted

## 2018-04-11 ENCOUNTER — Other Ambulatory Visit: Payer: Self-pay

## 2018-04-11 ENCOUNTER — Emergency Department (HOSPITAL_COMMUNITY): Payer: Medicaid Other

## 2018-04-11 ENCOUNTER — Encounter (HOSPITAL_COMMUNITY): Payer: Self-pay

## 2018-04-11 ENCOUNTER — Emergency Department (HOSPITAL_COMMUNITY)
Admission: EM | Admit: 2018-04-11 | Discharge: 2018-04-12 | Disposition: A | Discharge status: Home / Self Care | Payer: Medicaid Other | Attending: Emergency Medicine | Admitting: Emergency Medicine

## 2018-04-11 DIAGNOSIS — Z87891 Personal history of nicotine dependence: Secondary | ICD-10-CM | POA: Insufficient documentation

## 2018-04-11 DIAGNOSIS — Z79899 Other long term (current) drug therapy: Secondary | ICD-10-CM | POA: Diagnosis not present

## 2018-04-11 DIAGNOSIS — R1030 Lower abdominal pain, unspecified: Secondary | ICD-10-CM | POA: Insufficient documentation

## 2018-04-11 DIAGNOSIS — R1084 Generalized abdominal pain: Secondary | ICD-10-CM | POA: Diagnosis present

## 2018-04-11 LAB — I-STAT BETA HCG BLOOD, ED (MC, WL, AP ONLY)

## 2018-04-11 LAB — CBC
HEMATOCRIT: 41.2 % (ref 36.0–46.0)
HEMOGLOBIN: 13.2 g/dL (ref 12.0–15.0)
MCH: 29.6 pg (ref 26.0–34.0)
MCHC: 32 g/dL (ref 30.0–36.0)
MCV: 92.4 fL (ref 80.0–100.0)
NRBC: 0 % (ref 0.0–0.2)
Platelets: 325 10*3/uL (ref 150–400)
RBC: 4.46 MIL/uL (ref 3.87–5.11)
RDW: 12.1 % (ref 11.5–15.5)
WBC: 7.6 10*3/uL (ref 4.0–10.5)

## 2018-04-11 LAB — COMPREHENSIVE METABOLIC PANEL
ALT: 22 U/L (ref 0–44)
ANION GAP: 6 (ref 5–15)
AST: 24 U/L (ref 15–41)
Albumin: 4.2 g/dL (ref 3.5–5.0)
Alkaline Phosphatase: 65 U/L (ref 38–126)
BILIRUBIN TOTAL: 0.7 mg/dL (ref 0.3–1.2)
BUN: 16 mg/dL (ref 6–20)
CHLORIDE: 106 mmol/L (ref 98–111)
CO2: 24 mmol/L (ref 22–32)
Calcium: 8.5 mg/dL — ABNORMAL LOW (ref 8.9–10.3)
Creatinine, Ser: 0.57 mg/dL (ref 0.44–1.00)
Glucose, Bld: 106 mg/dL — ABNORMAL HIGH (ref 70–99)
POTASSIUM: 3.6 mmol/L (ref 3.5–5.1)
Sodium: 136 mmol/L (ref 135–145)
TOTAL PROTEIN: 7.4 g/dL (ref 6.5–8.1)

## 2018-04-11 LAB — URINALYSIS, ROUTINE W REFLEX MICROSCOPIC
BILIRUBIN URINE: NEGATIVE
Glucose, UA: NEGATIVE mg/dL
Hgb urine dipstick: NEGATIVE
Ketones, ur: NEGATIVE mg/dL
LEUKOCYTES UA: NEGATIVE
NITRITE: NEGATIVE
PH: 6 (ref 5.0–8.0)
Protein, ur: NEGATIVE mg/dL
SPECIFIC GRAVITY, URINE: 1.026 (ref 1.005–1.030)

## 2018-04-11 LAB — POC OCCULT BLOOD, ED: Fecal Occult Bld: NEGATIVE

## 2018-04-11 LAB — WET PREP, GENITAL
CLUE CELLS WET PREP: NONE SEEN
Sperm: NONE SEEN
Trich, Wet Prep: NONE SEEN
WBC, Wet Prep HPF POC: NONE SEEN
YEAST WET PREP: NONE SEEN

## 2018-04-11 LAB — LIPASE, BLOOD: LIPASE: 36 U/L (ref 11–51)

## 2018-04-11 MED ORDER — FENTANYL CITRATE (PF) 100 MCG/2ML IJ SOLN
50.0000 ug | Freq: Once | INTRAMUSCULAR | Status: AC
  Administered 2018-04-11: 50 ug via INTRAVENOUS
  Filled 2018-04-11: qty 2

## 2018-04-11 MED ORDER — ONDANSETRON 4 MG PO TBDP
4.0000 mg | ORAL_TABLET | Freq: Once | ORAL | Status: AC | PRN
  Administered 2018-04-11: 4 mg via ORAL
  Filled 2018-04-11: qty 1

## 2018-04-11 MED ORDER — ONDANSETRON HCL 4 MG/2ML IJ SOLN
4.0000 mg | Freq: Once | INTRAMUSCULAR | Status: AC
  Administered 2018-04-11: 4 mg via INTRAVENOUS
  Filled 2018-04-11: qty 2

## 2018-04-11 MED ORDER — IOPAMIDOL (ISOVUE-300) INJECTION 61%
100.0000 mL | Freq: Once | INTRAVENOUS | Status: AC | PRN
  Administered 2018-04-11: 100 mL via INTRAVENOUS

## 2018-04-11 MED ORDER — SODIUM CHLORIDE 0.9% FLUSH
3.0000 mL | Freq: Once | INTRAVENOUS | Status: AC
  Administered 2018-04-11: 3 mL via INTRAVENOUS

## 2018-04-11 MED ORDER — IOPAMIDOL (ISOVUE-300) INJECTION 61%
INTRAVENOUS | Status: AC
  Filled 2018-04-11: qty 100

## 2018-04-11 MED ORDER — SODIUM CHLORIDE 0.9 % IV BOLUS
1000.0000 mL | Freq: Once | INTRAVENOUS | Status: AC
  Administered 2018-04-11: 1000 mL via INTRAVENOUS

## 2018-04-11 MED ORDER — KETOROLAC TROMETHAMINE 15 MG/ML IJ SOLN
15.0000 mg | Freq: Once | INTRAMUSCULAR | Status: AC
  Administered 2018-04-11: 15 mg via INTRAVENOUS
  Filled 2018-04-11: qty 1

## 2018-04-11 MED ORDER — SODIUM CHLORIDE (PF) 0.9 % IJ SOLN
INTRAMUSCULAR | Status: AC
  Filled 2018-04-11: qty 50

## 2018-04-11 NOTE — ED Provider Notes (Signed)
38 year old female received at sign out from [place] Petrucelli pending fluid challenge. Per her HPI:   "DACIE WIND is a 38 y.o. female with a hx of nephrolithiasis, ovarian cysts, hiatal hernia, prior cholecystectomy, prior tubal ligation, and tachycardia who presents to the ED with complaint of abdominal pain that started at 0200 yesterday AM. Patient states pain is located in the mid to lower abdomen bilaterally, is a constant aching pain, worse with movement, no alleviating factors. She has had associated nausea with 4 episodes of non bloody emesis. She states she has tried Gas-X, GI cocktail, and Peptobismol without much relief. Her last BM was this AM and was normal with the exception of possible blood mixed in her stool, but she is not entirely sure. She may have had some mild increase in vaginal discharge, but nothing overly atypical for her. She is in a monogamous relationship with her husband without concern for STD. Denies fever, chills, dysuria, frequency, urgency, vaginal bleeding, diarrhea, melena, chest pain, or dyspnea. Denies recent foreign travel or abx use. LMP 04/02/18"  Physical Exam  BP 106/65 (BP Location: Right Arm)   Pulse 96   Temp 98.7 F (37.1 C) (Oral)   Resp 16   Ht 5\' 4"  (1.626 m)   Wt 68.5 kg   LMP 04/02/2018   SpO2 96%   BMI 25.92 kg/m   Physical Exam  NAD. Heart is regular rate. Speaks in complete, fluent sentences. Laying on her left side.   ED Course/Procedures     Procedures  MDM   38 year old female with a history of nephrolithiasis, ovarian cyst, hiatal hernia, prior cholecystectomy, tubal ligation, and tachycardia received at signout from [facility] pending fluid challenge. Please see PA Petrucelli's note for further medical decision making.  On re-evaluation, her nausea has resolved. She reports minimal improvement with pain after Toradol, but she is ready to go home.  We discussed trying IM Bentyl to see if her pain would improve and we will  send her home with a short course of oral Bentyl.  She has an upcoming an appointment with a new PCP.  Will also send home with p.o. Zofran.  She was given strict return precautions to the emergency department.  All questions were answered.  She is hemodynamically stable and in no acute distress.  She is safe for discharge to home with outpatient follow-up at this time.      Frederik Pear A, PA-C 04/12/18 0008    Ward, Layla Maw, DO 04/12/18 [id number]

## 2018-04-11 NOTE — Discharge Instructions (Addendum)
You were seen in the ER today for abdominal pain.  Your labs were all fairly normal- your calcium was a bit low at 8.5 and your blood sugar was a bit high at 106, these are labs to have rechecked by primary care within 1-2 weeks.  Your CT scan did not show any new abnormalities to explain your pain today. There was findings of a hemangioma on your liver as we discussed, this is something you can also further discuss with your primary care provider.   We are unsure of the exact cause of your discomfort, this may be due to a viral GI illness.   Please follow up with primary care within 3 days for re-evaluation. Return to the ER for new or worsening symptoms including but not limited to fever, increased pain, inability to keep fluids down, or any other concerns.

## 2018-04-11 NOTE — ED Triage Notes (Signed)
Patient c/o mid and lower abdominal pain and emesis since last night.

## 2018-04-11 NOTE — ED Notes (Signed)
Pt states that her pain was better until she started feeling really nauseated again. PA made aware.

## 2018-04-11 NOTE — ED Provider Notes (Signed)
[facility]-EMERGENCY DEPT Provider Note   CSN: [id number] Arrival date & time: 04/11/18  1754     History   Chief Complaint Chief Complaint  Patient presents with  . Abdominal Pain  . Emesis    HPI Teresa Massey is a 38 y.o. female with a hx of nephrolithiasis, ovarian cysts, hiatal hernia, prior cholecystectomy, prior tubal ligation, and tachycardia who presents to the ED with complaint of abdominal pain that started at 0200 yesterday AM. Patient states pain is located in the mid to lower abdomen bilaterally, is a constant aching pain, worse with movement, no alleviating factors. She has had associated nausea with 4 episodes of non bloody emesis. She states she has tried Gas-X, GI cocktail, and Peptobismol without much relief. Her last BM was this AM and was normal with the exception of possible blood mixed in her stool, but she is not entirely sure. She may have had some mild increase in vaginal discharge, but nothing overly atypical for her. She is in a monogamous relationship with her husband without concern for STD. Denies fever, chills, dysuria, frequency, urgency, vaginal bleeding, diarrhea, melena, chest pain, or dyspnea. Denies recent foreign travel or abx use. LMP 04/02/18   HPI  Past Medical History:  Diagnosis Date  . Anemia   . Genital herpes   . Gestational diabetes 2017  . H/O eye surgery    bil "lazy" eyes  . Headache    migraines  . History of hiatal hernia   . History of kidney stones   . Hx of bilateral breast reduction surgery 2009  . Hx of cholecystectomy 2005  . Kidney stones 2008, 2011  . Tachycardia     Patient Active Problem List   Diagnosis Date Noted  . Encounter for sterilization 08/18/2017  . Pelvic pain in female 08/18/2017  . Ovarian cyst, bilateral 08/18/2017    Past Surgical History:  Procedure Laterality Date  . BREAST REDUCTION SURGERY  2009  . CHOLECYSTECTOMY  2005  . EYE SURGERY     for a "lazy" eye (both  eyes)  . LAPAROSCOPIC TUBAL LIGATION Bilateral 08/18/2017   Procedure: LAPAROSCOPIC TUBAL LIGATION WITH FILSHIE CLIPS;  Surgeon: Willodean RosenthalHarraway-Smith, Carolyn, MD;  Location: [facility];  Service: Gynecology;  Laterality: Bilateral;  . LAPAROSCOPY Bilateral 08/18/2017   Procedure: LAPAROSCOPY DIAGNOSTIC WITH DRAINAGE OF BILATERAL OVARIAN CYSTS;  Surgeon: Willodean RosenthalHarraway-Smith, Carolyn, MD;  Location: [facility] OR[facility];  Service: Gynecology;  Laterality: Bilateral;  removal of ovarian cyst  . TUBAL LIGATION    . WISDOM TOOTH EXTRACTION  1995     OB History    Gravida  4   Para  3   Term  3   Preterm      AB  1   Living  3     SAB  0   TAB  1   Ectopic      Multiple  0   Live Births  3            Home Medications    Prior to Admission medications   Medication Sig Start Date End Date Taking? Authorizing Provider  Cholecalciferol (VITAMIN D3) 5000 units CAPS Take 5,000 Units by mouth daily.    [provider]  diclofenac (CATAFLAM) 50 MG tablet Take 1 tablet (50 mg total) by mouth 3 (three) times daily. 08/18/17   Willodean RosenthalHarraway-Smith, Carolyn, MD  Melatonin 5 MG TABS Take 5 mg by mouth at bedtime as needed (sleep).    [provider]  traMADol (ULTRAM) 50 MG tablet Take 1 tablet (50 mg total) by mouth every 6 (six) hours as needed (for pain). Patient not taking: Reported on 08/25/2017 08/18/17   Willodean Rosenthal, MD    Family History Family History  Problem Relation Age of Onset  . Asthma Father   . Hypertension Father   . Cancer Paternal Grandmother        pancreatic cancer    Social History Social History   Tobacco Use  . Smoking status: Former Smoker    Packs/day: 0.50    Years: 2.00    Pack years: 1.00    Types: Cigarettes    Last attempt to quit: 2005    Years since quitting: 15.0  . Smokeless tobacco: Never Used  Substance Use Topics  . Alcohol use: No  . Drug use: No     Allergies   Reglan [metoclopramide]; Dilaudid [hydromorphone hcl]; and  Morphine   Review of Systems Review of Systems  Constitutional: Negative for chills and fever.  Respiratory: Negative for shortness of breath.   Cardiovascular: Negative for chest pain.  Gastrointestinal: Positive for abdominal pain, nausea and vomiting. Negative for constipation and diarrhea.  Genitourinary: Positive for vaginal discharge. Negative for dysuria, frequency, hematuria and vaginal bleeding.  All other systems reviewed and are negative.    Physical Exam Updated Vital Signs BP 119/66 (BP Location: Left Arm)   Pulse (!) 104   Temp 98.7 F (37.1 C) (Oral)   Resp 16   Ht 5\' 4"  (1.626 m)   Wt 68.5 kg   LMP 04/02/2018   SpO2 96%   BMI 25.92 kg/m   Physical Exam Vitals signs and nursing note reviewed. Exam conducted with a chaperone present.  Constitutional:      General: She is in acute distress (mild, appears uncomfortable).     Appearance: She is well-developed. She is not toxic-appearing.  HENT:     Head: Normocephalic and atraumatic.  Eyes:     General:        Right eye: No discharge.        Left eye: No discharge.     Conjunctiva/sclera: Conjunctivae normal.  Neck:     Musculoskeletal: Neck supple.  Cardiovascular:     Rate and Rhythm: Regular rhythm. Tachycardia present.  Pulmonary:     Effort: Pulmonary effort is normal. No respiratory distress.     Breath sounds: Normal breath sounds. No wheezing, rhonchi or rales.  Abdominal:     General: There is no distension.     Palpations: Abdomen is soft.     Tenderness: There is abdominal tenderness in the right lower quadrant, epigastric area, periumbilical area, suprapubic area and left lower quadrant. There is no right CVA tenderness, left CVA tenderness, guarding or rebound.  Genitourinary:    Exam position: Supine.     Vagina: Vaginal discharge (mild clear to white) present.     Cervix: No friability or erythema.     Adnexa:        Right: No mass, tenderness or fullness.         Left: No mass,  tenderness or fullness.       Rectum: Guaiac result negative.     Comments: Patient uncomfortable throughout pelvic without focal tenderness.  No bright red blood or melena on DRE.  Skin:    General: Skin is warm and dry.     Findings: No rash.  Neurological:     Mental Status: She is alert.  Comments: Clear speech.   Psychiatric:        Behavior: Behavior normal.      ED Treatments / Results  Labs (all labs ordered are listed, but only abnormal results are displayed) Labs Reviewed  COMPREHENSIVE METABOLIC PANEL - Abnormal; Notable for the following components:      Result Value   Glucose, Bld 106 (*)    Calcium 8.5 (*)    All other components within normal limits  WET PREP, GENITAL  LIPASE, BLOOD  CBC  URINALYSIS, ROUTINE W REFLEX MICROSCOPIC  I-STAT BETA HCG BLOOD, ED ([facility], [facility], [facility] ONLY)  POC OCCULT BLOOD, ED  GC/CHLAMYDIA PROBE AMP ([place]) NOT AT [facility]    EKG None  Radiology Ct Abdomen Pelvis W Contrast  Result Date: 04/11/2018 CLINICAL DATA:  Abdominal pain with emesis EXAM: CT ABDOMEN AND PELVIS WITH CONTRAST TECHNIQUE: Multidetector CT imaging of the abdomen and pelvis was performed using the standard protocol following bolus administration of intravenous contrast. CONTRAST:  ISOVUE-300 IOPAMIDOL (ISOVUE-300) INJECTION 61% COMPARISON:  CT 06/21/2017 FINDINGS: Lower chest: Lung bases demonstrate calcified right middle lobe lung nodule, no change. Normal heart size. Hepatobiliary: Slight increased size of a 4.5 x 4.2 cm peripherally enhancing mass in the left hepatic lobe. Status post cholecystectomy. No biliary dilatation Pancreas: Unremarkable. No pancreatic ductal dilatation or surrounding inflammatory changes. Spleen: Normal in size without focal abnormality. Adrenals/Urinary Tract: Adrenal glands are unremarkable. Kidneys are normal, without renal calculi, focal lesion, or hydronephrosis. Bladder is unremarkable. Stomach/Bowel: Stomach is within normal  limits. Appendix appears normal. No evidence of bowel wall thickening, distention, or inflammatory changes. Vascular/Lymphatic: Nonaneurysmal aorta. Right lower quadrant mesenteric nodes up to 1 cm. Subcentimeter right posterior pelvic node. Reproductive: Heterogenous uterus. Tubal ligation clips. No adnexal mass Other: Negative for free air or free fluid Musculoskeletal: No acute or significant osseous findings. IMPRESSION: 1. No CT evidence for acute intra-abdominal or pelvic abnormality. 2. Slight increased size of a peripherally enhancing mass in the left hepatic lobe felt consistent with hemangioma. Electronically Signed   By: Jasmine Pang M.D.   On: 04/11/2018 22:34    Procedures Procedures (including critical care time)  Medications Ordered in ED Medications  sodium chloride flush (NS) 0.9 % injection 3 mL (has no administration in time range)  ondansetron (ZOFRAN-ODT) disintegrating tablet 4 mg (4 mg Oral Given 04/11/18 1846)     Initial Impression / Assessment and Plan / ED Course  I have reviewed the triage vital signs and the nursing notes.  Pertinent labs & imaging results that were available during my care of the patient were reviewed by me and considered in my medical decision making (see chart for details).   Patient presents to the ED with abdominal pain & associated nausea/vomiting. Nontoxic appearing, appears uncomfortable, vitals WNL other than tachycardia which is trending down. Exam with primarily mid and lower abdominal tenderness, she is tender over Mcburneys point- but this is not necessarily area of most prominent tenderness, no peritoneal signs, bimanual with diffuse discomfort, no focal tenderness. Bimanual and reported sexual hx not consistent with PID. DRE without melena or bright red blood.   Work-up reviewed:  CBC: No leukocytosis. No anemia.  CMP: Mild hypocalcemia at 8.5 and mild hyperglycemia at 106. No other electrolyte derangements. Renal function & LFTs WNL.   Lipase: WNL UA: No UTI Preg test: Negative, doubt ectopic.  Wet prep: Unremarkable. No BV/trich/yeast/WBCs.  GC/chlamydia: Pending.  CT abdomen/pelvis without acute intra-pelvic/abdominal pathology.   Re-assuring work-up thus  far. Patient with some increased nausea following fentanyl/contrast, zofran re-dosed & toradol ordered for pain control. Repeat abdominal exam remains without peritoneal signs. Unclear definitive etiology, possibly viral GI illness.   23:00: Patient care signed out to Mia McDonald PA-C at change of shift pending re-evaluation & PO challenge. If patient feeling improved and tolerating PO feel she can be discharged home with supportive measures & PCP follow up.   Final Clinical Impressions(s) / ED Diagnoses   Final diagnoses:  Lower abdominal pain    ED Discharge Orders    None       Desmond Lopeetrucelli, Cattaleya Wien R, PA-C 04/11/18 2322    Gwyneth SproutPlunkett, Whitney, MD 04/12/18 [phone]

## 2018-04-12 LAB — GC/CHLAMYDIA PROBE AMP (~~LOC~~) NOT AT ARMC
Chlamydia: NEGATIVE
NEISSERIA GONORRHEA: NEGATIVE

## 2018-04-12 MED ORDER — DICYCLOMINE HCL 20 MG PO TABS: 20.0000 mg | ORAL_TABLET | Freq: Two times a day (BID) | ORAL | 0 refills | Status: AC

## 2018-04-12 MED ORDER — DICYCLOMINE HCL 10 MG/ML IM SOLN
20.0000 mg | Freq: Once | INTRAMUSCULAR | Status: AC
  Administered 2018-04-12: 20 mg via INTRAMUSCULAR
  Filled 2018-04-12: qty 2

## 2018-04-12 MED ORDER — ONDANSETRON 4 MG PO TBDP: 4.0000 mg | ORAL_TABLET | Freq: Three times a day (TID) | ORAL | 0 refills | Status: AC | PRN

## 2018-04-17 ENCOUNTER — Other Ambulatory Visit: Payer: Self-pay

## 2018-04-17 ENCOUNTER — Emergency Department (HOSPITAL_COMMUNITY)
Admission: EM | Admit: 2018-04-17 | Discharge: 2018-04-17 | Disposition: A | Discharge status: Home / Self Care | Payer: Medicaid Other | Attending: Emergency Medicine | Admitting: Emergency Medicine

## 2018-04-17 ENCOUNTER — Encounter (HOSPITAL_COMMUNITY): Payer: Self-pay

## 2018-04-17 DIAGNOSIS — R1033 Periumbilical pain: Secondary | ICD-10-CM | POA: Insufficient documentation

## 2018-04-17 DIAGNOSIS — Z87891 Personal history of nicotine dependence: Secondary | ICD-10-CM | POA: Insufficient documentation

## 2018-04-17 DIAGNOSIS — Z79899 Other long term (current) drug therapy: Secondary | ICD-10-CM | POA: Diagnosis not present

## 2018-04-17 LAB — URINALYSIS, ROUTINE W REFLEX MICROSCOPIC
Bilirubin Urine: NEGATIVE
Glucose, UA: NEGATIVE mg/dL
Hgb urine dipstick: NEGATIVE
Ketones, ur: NEGATIVE mg/dL
Leukocytes, UA: NEGATIVE
Nitrite: NEGATIVE
Protein, ur: NEGATIVE mg/dL
Specific Gravity, Urine: 1.029 (ref 1.005–1.030)
pH: 5 (ref 5.0–8.0)

## 2018-04-17 LAB — COMPREHENSIVE METABOLIC PANEL
ALT: 20 U/L (ref 0–44)
AST: 13 U/L — ABNORMAL LOW (ref 15–41)
Albumin: 4.1 g/dL (ref 3.5–5.0)
Alkaline Phosphatase: 55 U/L (ref 38–126)
Anion gap: 7 (ref 5–15)
BILIRUBIN TOTAL: 0.3 mg/dL (ref 0.3–1.2)
BUN: 22 mg/dL — ABNORMAL HIGH (ref 6–20)
CO2: 27 mmol/L (ref 22–32)
Calcium: 8.9 mg/dL (ref 8.9–10.3)
Chloride: 103 mmol/L (ref 98–111)
Creatinine, Ser: 0.69 mg/dL (ref 0.44–1.00)
GFR calc Af Amer: >60 mL/min (ref >60)
GFR calc non Af Amer: >60 mL/min (ref >60)
GLUCOSE: 103 mg/dL — AB (ref 70–99)
Potassium: 3.9 mmol/L (ref 3.5–5.1)
Sodium: 137 mmol/L (ref 135–145)
Total Protein: 7.7 g/dL (ref 6.5–8.1)

## 2018-04-17 LAB — CBC
HCT: 41.4 % (ref 36.0–46.0)
Hemoglobin: 13.2 g/dL (ref 12.0–15.0)
MCH: 29.9 pg (ref 26.0–34.0)
MCHC: 31.9 g/dL (ref 30.0–36.0)
MCV: 93.9 fL (ref 80.0–100.0)
Platelets: 333 10*3/uL (ref 150–400)
RBC: 4.41 MIL/uL (ref 3.87–5.11)
RDW: 11.9 % (ref 11.5–15.5)
WBC: 7 10*3/uL (ref 4.0–10.5)
nRBC: 0 % (ref 0.0–0.2)

## 2018-04-17 LAB — I-STAT BETA HCG BLOOD, ED (MC, WL, AP ONLY): I-stat hCG, quantitative: <5 m[IU]/mL (ref <5)

## 2018-04-17 LAB — LIPASE, BLOOD: Lipase: 43 U/L (ref 11–51)

## 2018-04-17 MED ORDER — TRAMADOL HCL 50 MG PO TABS: 50.0000 mg | ORAL_TABLET | Freq: Four times a day (QID) | ORAL | 0 refills | Status: AC | PRN

## 2018-04-17 MED ORDER — FENTANYL CITRATE (PF) 100 MCG/2ML IJ SOLN
50.0000 ug | Freq: Once | INTRAMUSCULAR | Status: AC
  Administered 2018-04-17: 50 ug via INTRAVENOUS
  Filled 2018-04-17: qty 2

## 2018-04-17 MED ORDER — FENTANYL CITRATE (PF) 100 MCG/2ML IJ SOLN
75.0000 ug | Freq: Once | INTRAMUSCULAR | Status: AC
  Administered 2018-04-17: 75 ug via INTRAVENOUS
  Filled 2018-04-17: qty 2

## 2018-04-17 MED ORDER — KETOROLAC TROMETHAMINE 30 MG/ML IJ SOLN
30.0000 mg | Freq: Once | INTRAMUSCULAR | Status: AC
  Administered 2018-04-17: 30 mg via INTRAVENOUS
  Filled 2018-04-17: qty 1

## 2018-04-17 MED ORDER — SODIUM CHLORIDE 0.9% FLUSH
3.0000 mL | Freq: Once | INTRAVENOUS | Status: AC
  Administered 2018-04-17: 3 mL via INTRAVENOUS

## 2018-04-17 MED ORDER — ONDANSETRON HCL 4 MG/2ML IJ SOLN
4.0000 mg | Freq: Once | INTRAMUSCULAR | Status: AC
  Administered 2018-04-17: 4 mg via INTRAVENOUS
  Filled 2018-04-17: qty 2

## 2018-04-17 MED ORDER — SODIUM CHLORIDE 0.9 % IV BOLUS
1000.0000 mL | Freq: Once | INTRAVENOUS | Status: AC
  Administered 2018-04-17: 1000 mL via INTRAVENOUS

## 2018-04-17 NOTE — ED Triage Notes (Signed)
Pt reports continued mid and lower abdominal pain. She was sen Tuesday and told to return if the pain worsened. She states that the pain tonight woke her out of her sleep. A&Ox4. Ambulatory.

## 2018-04-17 NOTE — ED Provider Notes (Signed)
[facility]-EMERGENCY DEPT Provider Note   CSN: [id number] Arrival date & time: 04/17/18  0245     History   Chief Complaint Chief Complaint  Patient presents with  . Abdominal Pain    HPI Teresa Massey is a 38 y.o. female.  The history is provided by the patient and medical records.  Abdominal Pain    38 y.o. F with hx of anemia, herpes, headaches, hx of kidney stones, presenting to the ED with abdominal pain.  Patient reports she was seen here last Tuesday for same, no official diagnosis.  States she was discharged home with bentyl and zofran but no relief.  Continued to have pain in mid-abdomen that radiates downward.  States she has not had a lot of nausea/vomiting/diarrhea since she left the ED last week, but now has intense bloating and worsening abdominal pain when trying to eat.  She has tried eating bland foods but still having same symptoms.  No urinary symptoms.  No vaginal discharge or irregular bleeding.  No fevers/chills.  Had large BM yesterday after laxative. States she has appt with her PCP on Wednesday but could not wait until then.    Past Medical History:  Diagnosis Date  . Anemia   . Genital herpes   . Gestational diabetes 2017  . H/O eye surgery    bil "lazy" eyes  . Headache    migraines  . History of hiatal hernia   . History of kidney stones   . Hx of bilateral breast reduction surgery 2009  . Hx of cholecystectomy 2005  . Kidney stones 2008, 2011  . Tachycardia     Patient Active Problem List   Diagnosis Date Noted  . Encounter for sterilization 08/18/2017  . Pelvic pain in female 08/18/2017  . Ovarian cyst, bilateral 08/18/2017    Past Surgical History:  Procedure Laterality Date  . BREAST REDUCTION SURGERY  2009  . CHOLECYSTECTOMY  2005  . EYE SURGERY     for a "lazy" eye (both eyes)  . LAPAROSCOPIC TUBAL LIGATION Bilateral 08/18/2017   Procedure: LAPAROSCOPIC TUBAL LIGATION WITH FILSHIE CLIPS;  Surgeon:  Willodean RosenthalHarraway-Smith, Carolyn, MD;  Location: [facility];  Service: Gynecology;  Laterality: Bilateral;  . LAPAROSCOPY Bilateral 08/18/2017   Procedure: LAPAROSCOPY DIAGNOSTIC WITH DRAINAGE OF BILATERAL OVARIAN CYSTS;  Surgeon: Willodean RosenthalHarraway-Smith, Carolyn, MD;  Location: [facility];  Service: Gynecology;  Laterality: Bilateral;  removal of ovarian cyst  . TUBAL LIGATION    . WISDOM TOOTH EXTRACTION  1995     OB History    Gravida  4   Para  3   Term  3   Preterm      AB  1   Living  3     SAB  0   TAB  1   Ectopic      Multiple  0   Live Births  3            Home Medications    Prior to Admission medications   Medication Sig Start Date End Date Taking? Authorizing Provider  amphetamine-dextroamphetamine (ADDERALL XR) 10 MG 24 hr capsule Take 21 capsules by mouth daily.    [provider]  dicyclomine (BENTYL) 20 MG tablet Take 1 tablet (20 mg total) by mouth 2 (two) times daily. 04/12/18   McDonald, Mia A, PA-C  ibuprofen (ADVIL,MOTRIN) 600 MG tablet Take 600 mg by mouth 2 (two) times daily as needed for moderate pain.    [provider]  ondansetron (ZOFRAN ODT) 4 MG disintegrating tablet Take 1 tablet (4 mg total) by mouth every 8 (eight) hours as needed for nausea or vomiting. 04/12/18   McDonald, Mia A, PA-C  traMADol (ULTRAM) 50 MG tablet Take 1 tablet (50 mg total) by mouth every 6 (six) hours as needed (for pain). Patient taking differently: Take 50 mg by mouth every 6 (six) hours as needed (for pain).  08/18/17   Willodean Rosenthal, MD    Family History Family History  Problem Relation Age of Onset  . Asthma Father   . Hypertension Father   . Cancer Paternal Grandmother        pancreatic cancer    Social History Social History   Tobacco Use  . Smoking status: Former Smoker    Packs/day: 0.50    Years: 2.00    Pack years: 1.00    Types: Cigarettes    Last attempt to quit: 2005    Years since quitting: 15.0  . Smokeless tobacco: Never Used    Substance Use Topics  . Alcohol use: No  . Drug use: No     Allergies   Reglan [metoclopramide]; Dilaudid [hydromorphone hcl]; and Morphine   Review of Systems Review of Systems  Gastrointestinal: Positive for abdominal pain.  All other systems reviewed and are negative.    Physical Exam Updated Vital Signs BP 121/77 (BP Location: Left Arm)   Pulse 93   Temp 98.2 F (36.8 C) (Oral)   Resp 18   LMP 04/02/2018   SpO2 96%   Physical Exam Vitals signs and nursing note reviewed.  Constitutional:      Appearance: She is well-developed.  HENT:     Head: Normocephalic and atraumatic.  Eyes:     Conjunctiva/sclera: Conjunctivae normal.     Pupils: Pupils are equal, round, and reactive to light.  Neck:     Musculoskeletal: Normal range of motion.  Cardiovascular:     Rate and Rhythm: Normal rate and regular rhythm.     Heart sounds: Normal heart sounds.  Pulmonary:     Effort: Pulmonary effort is normal.     Breath sounds: Normal breath sounds.  Abdominal:     General: Bowel sounds are normal.     Palpations: Abdomen is soft.     Tenderness: There is no abdominal tenderness. There is no right CVA tenderness or left CVA tenderness.     Comments: Reports peri-umbilical pain but no focal tenderness or peritoneal signs  Musculoskeletal: Normal range of motion.  Skin:    General: Skin is warm and dry.  Neurological:     Mental Status: She is alert and oriented to person, place, and time.      ED Treatments / Results  Labs (all labs ordered are listed, but only abnormal results are displayed) Labs Reviewed  COMPREHENSIVE METABOLIC PANEL - Abnormal; Notable for the following components:      Result Value   Glucose, Bld 103 (*)    BUN 22 (*)    AST 13 (*)    All other components within normal limits  LIPASE, BLOOD  CBC  URINALYSIS, ROUTINE W REFLEX MICROSCOPIC  I-STAT BETA HCG BLOOD, ED ([facility], [facility], [facility] ONLY)    EKG None  Radiology No results  found.  Procedures Procedures (including critical care time)  Medications Ordered in ED Medications  sodium chloride flush (NS) 0.9 % injection 3 mL (3 mLs Intravenous Given 04/17/18 0454)  ondansetron (ZOFRAN) injection 4 mg (4 mg Intravenous Given 04/17/18  0455)  fentaNYL (SUBLIMAZE) injection 50 mcg (50 mcg Intravenous Given 04/17/18 0456)  sodium chloride 0.9 % bolus 1,000 mL (0 mLs Intravenous Stopped 04/17/18 0603)  ketorolac (TORADOL) 30 MG/ML injection 30 mg (30 mg Intravenous Given 04/17/18 0457)  fentaNYL (SUBLIMAZE) injection 75 mcg (75 mcg Intravenous Given 04/17/18 0600)     Initial Impression / Assessment and Plan / ED Course  I have reviewed the triage vital signs and the nursing notes.  Pertinent labs & imaging results that were available during my care of the patient were reviewed by me and considered in my medical decision making (see chart for details).  38 year old female here with lower abdominal pain.  Ongoing since last Tuesday, 04/11/2018.  No real change in pain, still localized to mid abdomen with radiation downward.  Nausea and vomiting has mostly resolved but now she has more intense pain when trying to eat.  States she feels bloated after eating.  She is afebrile and nontoxic in appearance.  Her abdomen is overall soft and benign.  Repeat labs today are grossly unchanged from prior.  CT scan at last visit reviewed, no acute findings.  She did recently take a laxative and had a very large bowel movement yesterday without change in symptoms.  At this time, do not feel repeat imaging indicated at this time.  Given IV fluids here medications for symptomatic control.  5:54 AM Patient reassessed-- states no change in symptoms but clinically appears more improved.  CT reviewed again-- there were some enlarged lymph nodes in right lower abdomen and pelvis mentioned in body of report, question if this could represent a mesenteric adenitis picture. No other concerning mass-like or  infectious pathology noted.  VSS today.  Still do not feel she needs repeat imaging at this time.  She has appointment with her PCP in 2 days, will also give follow-up with GI.  Rx tramadol, can continue bentyl and zofran PRN.  She can return here for any new/acute changes.  Final Clinical Impressions(s) / ED Diagnoses   Final diagnoses:  Periumbilical abdominal pain    ED Discharge Orders         Ordered    traMADol (ULTRAM) 50 MG tablet  Every 6 hours PRN     04/17/18 0624           Garlon Hatchet, PA-C 04/17/18 0630    Shaune Pollack, MD 04/18/18 1630

## 2018-04-17 NOTE — ED Notes (Signed)
Patient given discharge teaching and verbalized understanding. Patient ambulated out of ED with a steady gait. 

## 2018-04-17 NOTE — Discharge Instructions (Signed)
Take the prescribed medication as directed.  Can continue bentyl/zofran. Follow-up with your primary care doctor on Wednesday. Can also follow-up with GI if ongoing issues. Return to the ED for new or worsening symptoms.

## 2018-04-20 ENCOUNTER — Other Ambulatory Visit: Payer: Self-pay | Admitting: Gastroenterology

## 2018-04-20 DIAGNOSIS — R10814 Left lower quadrant abdominal tenderness: Secondary | ICD-10-CM

## 2018-04-27 ENCOUNTER — Ambulatory Visit
Admission: RE | Admit: 2018-04-27 | Discharge: 2018-04-27 | Disposition: A | Discharge status: Home / Self Care | Payer: Medicaid Other | Source: Ambulatory Visit | Attending: Gastroenterology | Admitting: Gastroenterology

## 2018-04-27 DIAGNOSIS — R10814 Left lower quadrant abdominal tenderness: Secondary | ICD-10-CM

## 2019-09-13 IMAGING — CT CT ABD-PELV W/ CM
2 of 4 series · 15 of 46 positions shown, 17 images · IV contrast (iopamidol)
Comparison: CT 06/14/2008

CLINICAL DATA: Lower abdominal pain and abdominal distension.

EXAM:
CT ABDOMEN AND PELVIS WITH CONTRAST
TECHNIQUE: Multidetector CT imaging of the abdomen and pelvis was performed
using the standard protocol following bolus administration of
intravenous contrast.
CONTRAST:  100mL [id number] IOPAMIDOL (WVIUN9-699) INJECTION 61%

[Series 2: axial st · axial · 0.66mm/px · z∈[-429,-29]mm · 12 of 90 slices shown, 14 images]
[im 5/90  soft-tissue]
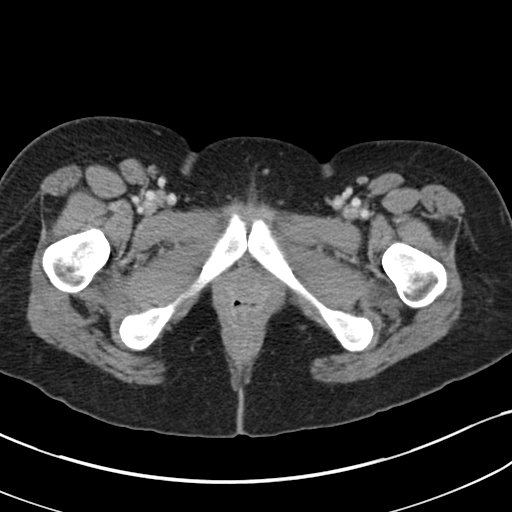
[im 5/90  bone]
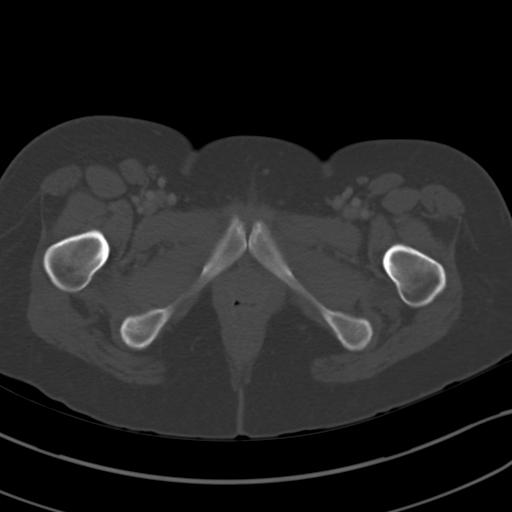
[im 13/90  soft-tissue]
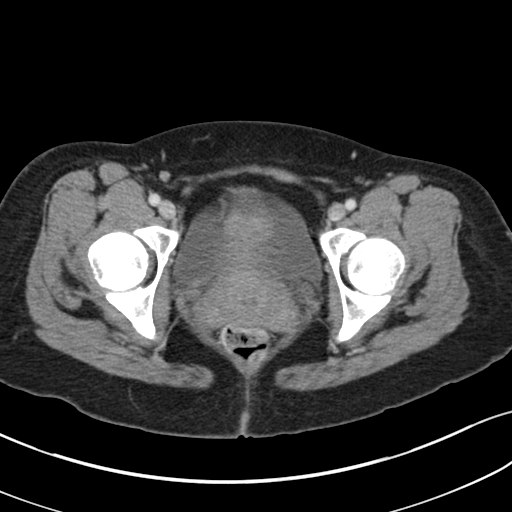
[im 22/90  soft-tissue]
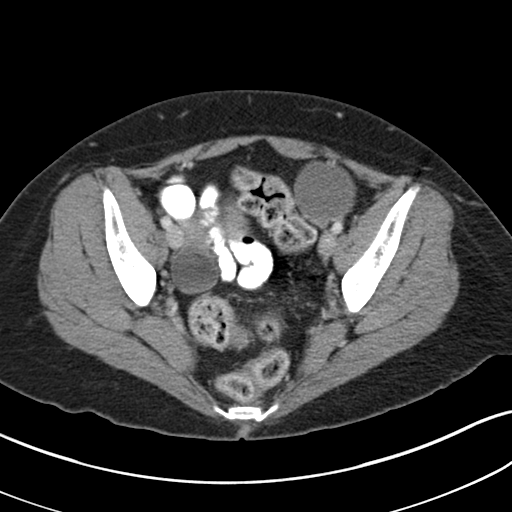
[im 26/90  soft-tissue]
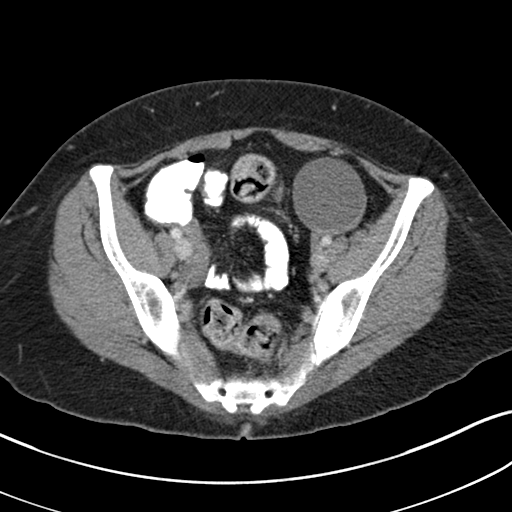
[im 34/90  soft-tissue]
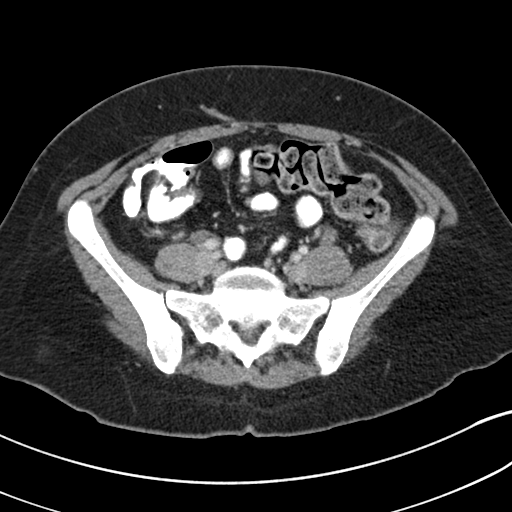
[im 43/90  soft-tissue]
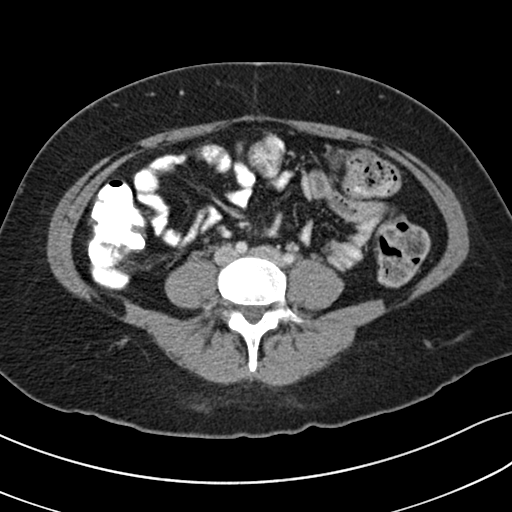
[im 47/90  soft-tissue]
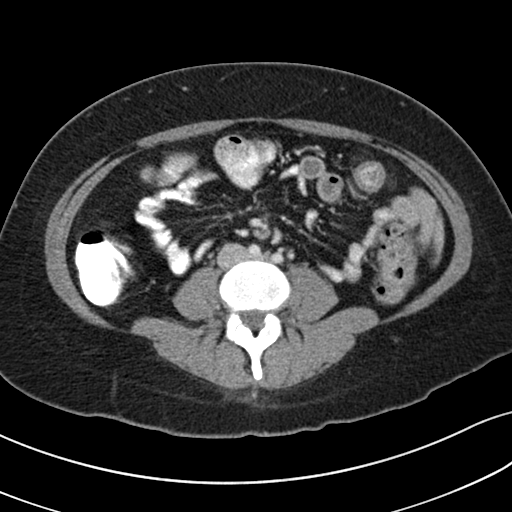
[im 56/90  soft-tissue]
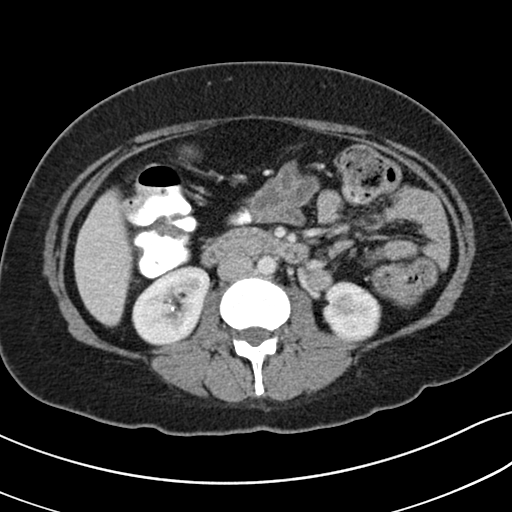
[im 64/90  soft-tissue]
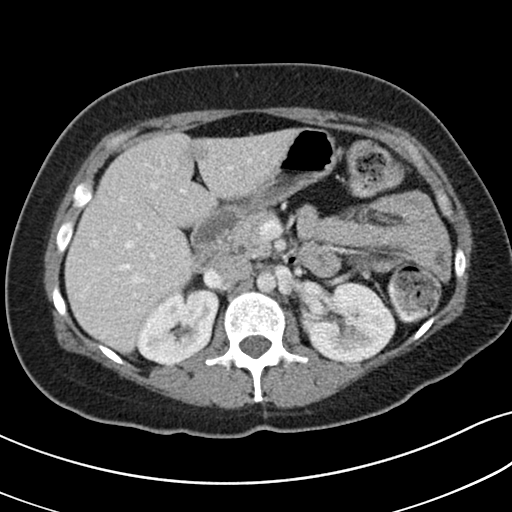
[im 64/90  bone]
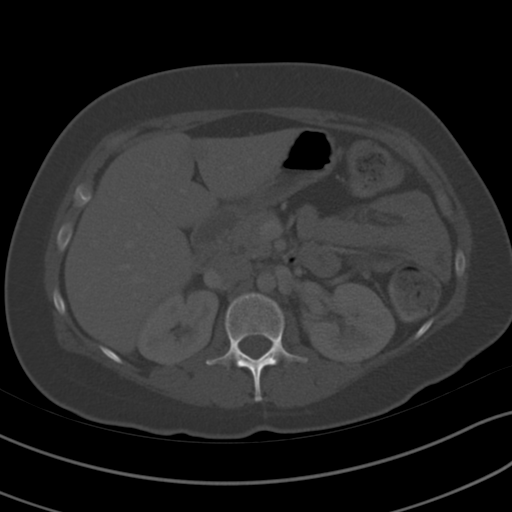
[im 68/90  soft-tissue]
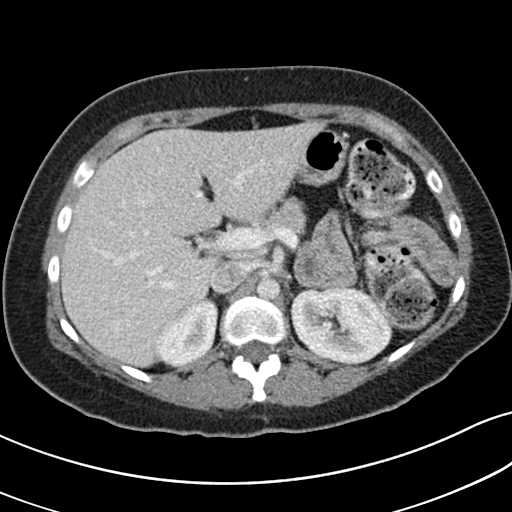
[im 77/90  soft-tissue]
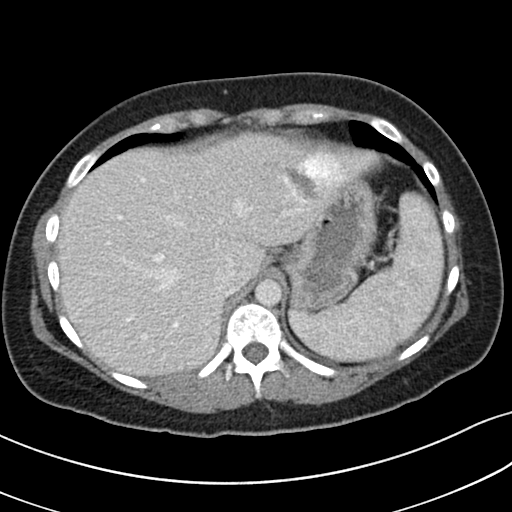
[im 85/90  soft-tissue]
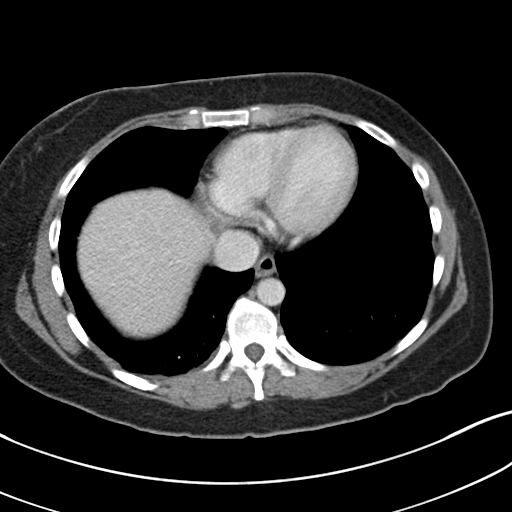

[Series 4: coronal st · coronal · 0.59mm/px · 3 of 76 slices shown]
[im 26/76  soft-tissue]
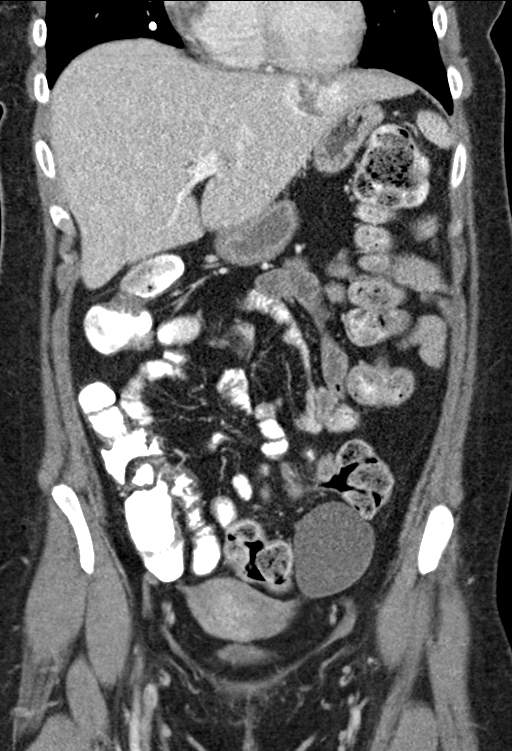
[im 34/76  soft-tissue]
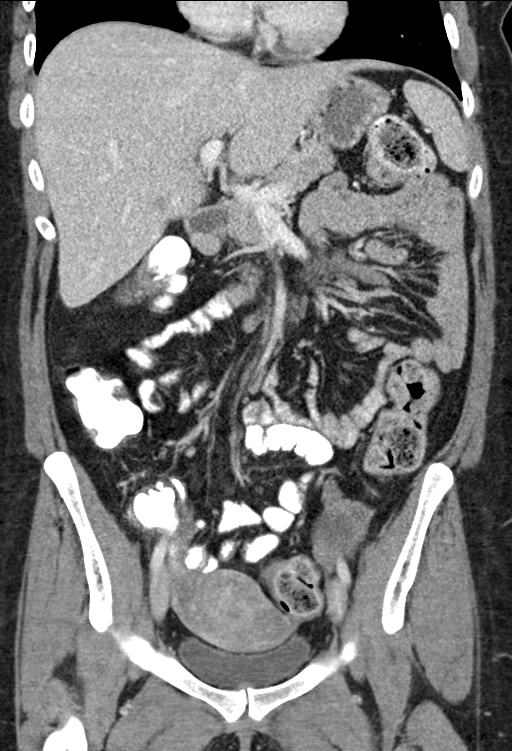
[im 42/76  soft-tissue]
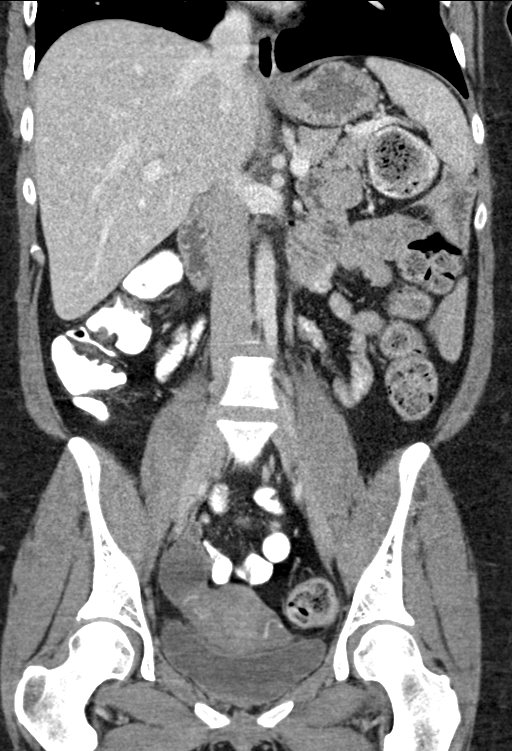

[15 of 46 positions shown; findings below may reference images not displayed]

FINDINGS: Lower chest: Calcified nodule in the right middle lobe, likely
granuloma. No pleural fluid or consolidation.

Hepatobiliary: 3.6 cm peripherally enhancing a lesion in the left
lobe of the liver with peripheral contrast puddling consistent with
hemangioma. No suspicious liver lesion. Clips in the gallbladder
fossa postcholecystectomy. No biliary dilatation.

Pancreas: No ductal dilatation or inflammation.

Spleen: Normal in size without focal abnormality.

Adrenals/Urinary Tract: Normal adrenal glands. There are bilateral
nonobstructing renal stones. No hydronephrosis or perinephric edema.
Homogeneous renal enhancement. Urinary bladder is partially
distended without wall thickening.

Stomach/Bowel: Stomach is within normal limits. Appendix appears
normal. No evidence of bowel wall thickening, distention, or
inflammatory changes. Moderate colonic stool burden.

Vascular/Lymphatic: No significant vascular findings are present.
Few prominent ileocolic nodes are similar to prior exam in appear
benign. No enlarged abdominal or pelvic lymph nodes.

Reproductive: 4.8 cm minimally complex left ovarian cyst. 3.1 cm
right ovarian cyst. Uterus is unremarkable.

Other: No free air, free fluid, or intra-abdominal fluid collection.

Musculoskeletal: There are no acute or suspicious osseous
abnormalities.
IMPRESSION: 1. Bilateral ovarian cysts, 4.8 cm on the left and 3.1 cm on the
right. Given size and patient age, no dedicated imaging follow-up is
needed.
2. Bilateral nonobstructing nephrolithiasis.
3. Moderate stool burden in the colon, can be seen with
constipation.
4. Incidental hepatic hemangioma.
